# Patient Record
Sex: Male | Born: 1960 | Race: Black or African American | Hispanic: No | Marital: Married | Smoking: Never smoker
Health system: Southern US, Community
[De-identification: ages and names within clinical notes are randomized; demographics above are authoritative.]

## PROBLEM LIST (undated history)

## (undated) DIAGNOSIS — I10 Essential (primary) hypertension: Secondary | ICD-10-CM

## (undated) DIAGNOSIS — L0291 Cutaneous abscess, unspecified: Secondary | ICD-10-CM

## (undated) DIAGNOSIS — E785 Hyperlipidemia, unspecified: Secondary | ICD-10-CM

## (undated) HISTORY — PX: HIP SURGERY: SHX245

## (undated) HISTORY — PX: CATARACT EXTRACTION: SUR2

## (undated) HISTORY — PX: ELBOW SURGERY: SHX618

## (undated) HISTORY — PX: JOINT REPLACEMENT: SHX530

## (undated) HISTORY — DX: Hyperlipidemia, unspecified: E78.5

---

## 2001-10-04 ENCOUNTER — Encounter: Admission: RE | Admit: 2001-10-04 | Discharge: 2001-10-04 | Payer: Self-pay | Admitting: Family Medicine

## 2001-10-04 ENCOUNTER — Encounter: Payer: Self-pay | Admitting: Family Medicine

## 2005-12-01 ENCOUNTER — Emergency Department (HOSPITAL_COMMUNITY): Admission: EM | Admit: 2005-12-01 | Discharge: 2005-12-01 | Payer: Self-pay | Admitting: Emergency Medicine

## 2005-12-09 ENCOUNTER — Emergency Department (HOSPITAL_COMMUNITY): Admission: EM | Admit: 2005-12-09 | Discharge: 2005-12-09 | Payer: Self-pay | Admitting: Emergency Medicine

## 2006-05-30 ENCOUNTER — Emergency Department (HOSPITAL_COMMUNITY): Admission: EM | Admit: 2006-05-30 | Discharge: 2006-05-30 | Payer: Self-pay | Admitting: Emergency Medicine

## 2006-05-31 ENCOUNTER — Emergency Department (HOSPITAL_COMMUNITY): Admission: EM | Admit: 2006-05-31 | Discharge: 2006-05-31 | Payer: Self-pay | Admitting: Emergency Medicine

## 2006-06-01 ENCOUNTER — Emergency Department (HOSPITAL_COMMUNITY): Admission: EM | Admit: 2006-06-01 | Discharge: 2006-06-01 | Payer: Self-pay | Admitting: Emergency Medicine

## 2006-06-02 ENCOUNTER — Emergency Department (HOSPITAL_COMMUNITY): Admission: EM | Admit: 2006-06-02 | Discharge: 2006-06-02 | Payer: Self-pay | Admitting: Emergency Medicine

## 2006-06-03 ENCOUNTER — Emergency Department (HOSPITAL_COMMUNITY): Admission: EM | Admit: 2006-06-03 | Discharge: 2006-06-03 | Payer: Self-pay | Admitting: Emergency Medicine

## 2006-06-04 ENCOUNTER — Emergency Department (HOSPITAL_COMMUNITY): Admission: EM | Admit: 2006-06-04 | Discharge: 2006-06-04 | Payer: Self-pay | Admitting: Emergency Medicine

## 2007-03-11 ENCOUNTER — Emergency Department (HOSPITAL_COMMUNITY): Admission: EM | Admit: 2007-03-11 | Discharge: 2007-03-11 | Payer: Self-pay | Admitting: Emergency Medicine

## 2007-03-16 ENCOUNTER — Emergency Department (HOSPITAL_COMMUNITY): Admission: EM | Admit: 2007-03-16 | Discharge: 2007-03-16 | Payer: Self-pay | Admitting: Emergency Medicine

## 2009-03-01 ENCOUNTER — Ambulatory Visit: Payer: Self-pay | Admitting: Vascular Surgery

## 2009-05-25 ENCOUNTER — Ambulatory Visit: Payer: Self-pay | Admitting: Vascular Surgery

## 2009-07-12 ENCOUNTER — Ambulatory Visit: Payer: Self-pay | Admitting: Vascular Surgery

## 2009-07-20 ENCOUNTER — Ambulatory Visit: Payer: Self-pay | Admitting: Vascular Surgery

## 2009-08-16 ENCOUNTER — Ambulatory Visit: Payer: Self-pay | Admitting: Vascular Surgery

## 2009-08-25 ENCOUNTER — Ambulatory Visit: Payer: Self-pay | Admitting: Vascular Surgery

## 2009-09-13 ENCOUNTER — Ambulatory Visit: Payer: Self-pay | Admitting: Vascular Surgery

## 2009-09-21 ENCOUNTER — Ambulatory Visit: Payer: Self-pay | Admitting: Vascular Surgery

## 2010-06-14 NOTE — Procedures (Signed)
LOWER EXTREMITY VENOUS REFLUX EXAM   INDICATION:  Bilateral varicose veins with episodes of bleeding on the  left lower extremity.   EXAM:  Using color-flow imaging and pulse Doppler spectral analysis, the  right and left common femoral, superficial femoral, popliteal, posterior  tibial, greater and lesser saphenous veins are evaluated.  There is  evidence suggesting deep venous insufficiency in the right and left  lower extremities.   The  right and left saphenofemoral junctions are not competent with  reflux of  >500 milliseconds. The right and left GSV are not competent  with reflux of  >500 milliseconds with the caliber as described below.   The right and left proximal short saphenous veins demonstrates  incompetency with the right measuring 0.25 cm to 1.75 cm and the left  0.26 cm to 1.6 cm.   GSV Diameter (used if found to be incompetent only)                                            Right    Left  Proximal Greater Saphenous Vein           0.54 cm  cm  Proximal-to-mid-thigh                     cm       cm  Mid thigh                                 0.49 cm  0.45 cm  Mid-distal thigh                          cm       cm  Distal thigh                              0.22 cm  0.45 cm  Knee                                      0.38 cm  cm   IMPRESSION:  1. Right greater saphenous vein reflux with >500 milliseconds is      identified with the caliber ranging from 0.22 cm to 0.54 cm knee to      groin; the left measuring 0.45 cm.  2. The right and left greater saphenous veins are not aneurysmal.  3. The right and left greater saphenous veins are tortuous in areas.  4. The deep venous system is not competent with reflux of >500      milliseconds.  5. The right and left lesser saphenous veins are not competent with      reflux of  >500 milliseconds.         ___________________________________________  [name] Rochester, M.D.   AS/MEDQ  D:  03/01/2009  T:   03/02/2009  Job:  [id number]

## 2010-06-14 NOTE — Assessment & Plan Note (Signed)
OFFICE VISIT   Roy, [name] A  DOB:  27-Oct-1960                                       08/16/2009  EAVWU#:[id number]   The patient had laser ablation of his right small saphenous vein with 10-  20 stab phlebectomies for painful bulging varicosities in the right calf  and ankle area.  He tolerated the procedure well.  He will return on  July 27th for venous duplex exam of the right leg and to be seen by Dr.  Arbie Cookey.     [name] Rochester, M.D.  Electronically Signed   JDL/MEDQ  D:  08/16/2009  T:  08/17/2009  Job:  [id number]

## 2010-06-14 NOTE — Assessment & Plan Note (Signed)
OFFICE VISIT   LEVANDER, KATZENSTEIN A  DOB:  [birth year]                                       07/12/2009  EAVWU#:[id number]   Roy [name] had laser ablation of the left small saphenous vein with  greater than 20 stab phlebectomies of large bulging varicosities in the  calf and medial and posterior areas.  He tolerated the procedure well.  He will return in 1 week for venous duplex exam of his left small  saphenous vein.  He will then be scheduled for laser ablation of the  right small saphenous followed by the right great saphenous system.     [name] Rochester, M.D.  Electronically Signed   JDL/MEDQ  D:  07/12/2009  T:  07/13/2009  Job:  [id number]

## 2010-06-14 NOTE — Assessment & Plan Note (Signed)
OFFICE VISIT   Roy [name], Roy [name]  DOB:  1960/12/24                                       09/13/2009  [id number]#:[id number]   The patient had laser ablation of his right great saphenous vein from  the mid-thigh to the saphenofemoral junction.  He tolerated the  procedure well.  This completes his treatment regimen and he will return  in 1 week for duplex scan of the right leg to confirm closure of the  right great saphenous vein.     [name] Rochester, M.D.  Electronically Signed   JDL/MEDQ  D:  09/13/2009  T:  09/13/2009  Job:  [id number]

## 2010-06-14 NOTE — Assessment & Plan Note (Signed)
OFFICE VISIT   GAIUS, ISHAQ A  DOB:  [birth year]                                       09/21/2009  NFAOZ#:[id number]   The patient returns 1 week post laser ablation of his right great  saphenous vein for severe venous hypertension.  He previously has had  ablation of his right small saphenous with stab phlebectomies as well as  his left small saphenous with stab phlebectomies.  He states that the  size of both legs is significantly smaller than prior to his procedures.  He has had some mild to moderate discomfort in the proximal thigh on the  right side from the recent ablation but that has now resolved.  He has  returned to work.  He is wearing the stockings on the right leg for one  more week and has taken ibuprofen as instructed.   Today venous duplex exam reveals total closure of the right great  saphenous and the right small saphenous veins with no DVT.  He was  reassured regarding these findings.  He is pleased with his result and  will return to see [place] on a p.r.n. basis.     [name] Rochester, M.D.  Electronically Signed   JDL/MEDQ  D:  09/21/2009  T:  09/22/2009  Job:  [id number]

## 2010-06-14 NOTE — Assessment & Plan Note (Signed)
OFFICE VISIT   Roy [name], Roy [name]  DOB:  1960-10-25                                       05/25/2009  FAOZH#:[id number]   The patient returns today for further followup regarding his severe  bilateral venous insufficiency.  This gentleman has [name] history of [name]  stasis ulcer in the left ankle as well as three episodes of bleeding  most recently in November of 2010 and he has had two trips to the  emergency department at [facility] for ligation of bleeding  varicosities.  He has been wearing long leg elastic compression  stockings on both legs for the past 3 months and continues to have  aching, throbbing, burning discomfort as well as itching in both thighs  and calves the more he stands on his feet.  He has not had any recurrent  bleeding since November.  He has no history of DVT or thrombophlebitis  but has chronic swelling in both ankles which worsens as the day  progresses.  He does have documented severe reflux in the right great  saphenous vein in both small saphenous veins.   On exam today he continues to have bulging varicosities in both legs in  the medial and posterior calves extending down into the ankles with [name]  healed ulcer anterior to the left medial malleolus and 2 to 3+ dorsalis  pedis pulses bilaterally.   This gentleman has had no improvement with conservative management and  because of his history of ulcers and bleeding he does need the following  procedures:  1) laser ablation of the left small saphenous vein with  multiple stab phlebectomies, 2) laser ablation of the right small  saphenous vein with multiple stab phlebectomies, 3) laser ablation of  the right great saphenous.  We will proceed with precertification to do  these in the near future.     [name] Rochester, M.D.  Electronically Signed   JDL/MEDQ  D:  05/25/2009  T:  05/26/2009  Job:  [id number]   cc:   Renaye Rakers, M.D.

## 2010-06-14 NOTE — Assessment & Plan Note (Signed)
OFFICE VISIT   Roy [name], Roy [name]  DOB:  [birth year]                                       08/25/2009  ZOXWR#:[id number]   The patient presents today for followup of right small saphenous vein  laser ablation and stab phlebectomy by Dr. Hart Rochester on 07/18.  He did well  with the procedure and has minimal pain associated with this.   He underwent duplex which shows no evidence of DVT and ablation of [name]  small saphenous vein.  He is quite pleased with his result and will  follow up with next planned procedure which is right great saphenous  vein ablation with Dr. Hart Rochester on 09/13/2009.     Larina Earthly, M.D.  Electronically Signed   TFE/MEDQ  D:  08/25/2009  T:  08/26/2009  Job:  [id number]

## 2010-06-14 NOTE — Assessment & Plan Note (Signed)
OFFICE VISIT   CAYLON, SAINE A  DOB:  [birth year]                                       07/20/2009  [id number]#:[id number]   The patient had laser ablation of his left small saphenous vein with  greater than 20 stab phlebectomies June 13 for painful varicosities in  the left calf.  He states that the leg feels much better with less  swelling in the left ankle and less tightness and bulging discomfort in  the calf.  He has been wearing his elastic compression stocking and  taking ibuprofen as directed.  Venous duplex today reveals no evidence  of deep venous obstruction, total closure of the small saphenous vein  from the mid calf to the saphenofemoral junction.  There is some flow in  his distal lesser saphenous vein probably from incompetent perforators.  In general he is doing well and we will schedule him for his other two  procedures the next being the laser ablation of his right small  saphenous to be followed by laser ablation of his right great saphenous.     [name] Rochester, M.D.  Electronically Signed   JDL/MEDQ  D:  07/20/2009  T:  07/21/2009  Job:  [id number]

## 2010-06-14 NOTE — Procedures (Signed)
DUPLEX DEEP VENOUS EXAM - LOWER EXTREMITY   INDICATION:  Left lesser saphenous vein ablation.   HISTORY:  Edema:  Yes  Trauma/Surgery:  One-week followup left lesser saphenous vein ablation  Pain:  Yes  PE:  No  Previous DVT:  No  Anticoagulants:  No  Other:  No   DUPLEX EXAM:                CFV   SFV   PopV  PTV    GSV                R  L  R  L  R  L  R   L  R  L  Thrombosis    0  0     0     0      0     0  Spontaneous   +  +     +     +      +     +  Phasic        +  +     +     +      +     +  Augmentation  +  +     +     +      +     +  Compressible  +  +     +     +      +     +  Competent     0  0     0     0      0     +   Legend:  + - yes  o - no  p - partial  D - decreased   IMPRESSION:  There does not appear to be any deep venous thrombus noted  in the left leg.  There is reflux noted in the left leg deep vein  system.  The left lesser saphenous vein appears ablated at the junction  to the mid calf.    _____________________________  Roy [name]. Hart Rochester, M.D.   CB/MEDQ  D:  07/20/2009  T:  07/20/2009  Job:  [id number]

## 2010-06-14 NOTE — Consult Note (Signed)
NEW PATIENT CONSULTATION   Roy Roy [name], Roy Roy [name]  DOB:  Dec 06, 1960                                       03/01/2009  PPIRJ#:[id number]   The patient is Roy [name] 50 year old male referred by Dr. Parke Simmers for severe  venous insufficiency of both legs with Roy [name] history of stasis ulcer in the  left ankle as well as bleeding on 3 occasions.  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  He has described active bleeding on each  occasion which has stopped with local pressure.  He has been treating  this with local ointment but  has not worn elastic compression stockings  on Roy [name] regular basis.  He has aching and throbbing discomfort in the  distal thigh and calf bilaterally left worse than right and also has had  increasing edema of the left lower third of the leg, ankle and foot with  darkening of the skin over the last few years and ulcer being present on  the left side.  He has no history of deep venous thrombosis or  thrombophlebitis but does have significant edema.   CHRONIC STABLE MEDICAL PROBLEMS:  1. Hypertension.  2. Degenerative joint disease.  Previous bilateral hip replacements.      Negative for coronary artery disease, diabetes, COPD or stroke.   FAMILY HISTORY:  Negative for coronary artery disease, diabetes and  stroke.   SOCIAL HISTORY:  He is married, has 3 children.  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  He has not smoked since 1984.  Drinks  occasional alcohol.   REVIEW OF SYSTEMS:  Has 300+ weight, 6 feet 1 inch tall, has good  appetite.  Denies any chest pain, dyspnea on exertion, PND, orthopnea.  Does have diffuse arthritis.  No GI or GU symptoms.  All other systems  in the review of systems are negative.   ALLERGIES:  None.   PHYSICAL EXAMINATION:  VITAL SIGNS:  Blood pressure 177/108, heart  rate  is 103, respirations 24.  GENERAL:  He is an obese middle-aged male who is in no apparent  distress, alert and oriented x3.  He is well-developed and well-  nourished.  NECK:  Supple, 3+ carotid pulses are palpable. No bruits are audible.  HEENT:  Exam is intact.  EOMs are intact.  Pupils are equal.  NECK:  Supple with no palpable adenopathy.  No bruits are noted.  CHEST:  Clear to auscultation.  CARDIOVASCULAR:  Regular rhythm.  No murmurs.  ABDOMEN:  Obese.  No palpable masses.  MUSCULOSKELETAL:  Exam reveals no major deformities.  NEUROLOGIC:  Normal.  SKIN:  Reveals bilateral hyperpigmentation lower third of the leg, left  greater than right with Roy [name] healed ulcer near the left medial malleolus  with an eschar about 3 to 4 mm in size overlying this.  There are  bulging varicosities in both lower extremities particularly beginning at  the knee to the ankle medially and posteriorly with Roy [name] large bulging  varicosity on the medial side of the right leg, 10 cm proximal to the  medial malleolus.  He has 2+ edema bilaterally.  Venous duplex exam was ordered and I reviewed and interpreted this  today.  He has the following findings:  He has deep venous reflux  bilaterally as well as severe reflux in the left small saphenous vein  communicating with these bulging varicosities.  The left great saphenous  vein in the thigh is competent.  On the right side there is incompetence  at the proximal right great saphenous vein from the junction to the mid  thigh and there is also incompetence of the right small saphenous vein  similar to the left side.   This patient has had 3 bleeding episodes due to deep venous  hypertension, has hyperpigmentation, stasis ulcers and pain and does  need treatment.  We fitted him for long-leg elastic compression  stockings (20 mm - 30 mm gradient) and have advised him to also elevate  his legs as much as possible although he cannot do this at work.  He  will  also take ibuprofen.  He will return in 3 months and if there has  been no improvement, he should have:  1. Laser ablation of the left small saphenous vein with multiple stab      phlebectomies.  2. Laser ablation of the right small saphenous vein with multiple stab      phlebectomies.  3. Laser ablation of the right great saphenous vein.   If he gets any further bleeding in the interim, he will be in touch with  [place].     Roy Roy [name], M.D.  Electronically Signed   JDL/MEDQ  D:  03/01/2009  T:  03/02/2009  Job:  [id number]   cc:   Roy Roy [name], M.D.

## 2010-06-14 NOTE — Procedures (Signed)
DUPLEX DEEP VENOUS EXAM - LOWER EXTREMITY   INDICATION:  Follow up right smaller saphenous vein ELAS and  phlebectomies.   HISTORY:  Edema:  No.  Trauma/Surgery:  Right smaller saphenous vein ELAS and phlebectomies on  08/16/09.  Pain:  No.  PE:  No.  Previous DVT:  No.  Anticoagulants:  No.  Other:   DUPLEX EXAM:                CFV   SFV   PopV  PTV    GSV                R  L  R  L  R  L  R   L  R  L  Thrombosis    o  o  o     o     o      o  Spontaneous   +  +  +     +     +      +  Phasic        +  +  +     +     +      +  Augmentation  +  +  +     +     +      +  Compressible  +  +  +     +     +      +  Competent     +  +  +     +     +      +   Legend:  + - yes  o - no  p - partial  D - decreased   IMPRESSION:  1. No evidence of deep venous thrombosis in the right lower extremity.  2. The smaller saphenous vein appears to be ablated from the junction      to the mid to distal calf.  The areas of phlebectomy also appear to      be closed off.    _____________________________  Larina Earthly, M.D.   NT/MEDQ  D:  08/25/2009  T:  08/25/2009  Job:  [id number]

## 2010-06-14 NOTE — Procedures (Signed)
DUPLEX DEEP VENOUS EXAM - LOWER EXTREMITY   INDICATION:  One-week followup of right greater saphenous vein laser  ablation.   HISTORY:  Edema:  No.  Trauma/Surgery:  Left short saphenous vein laser ablation on 07/12/2009,  right short saphenous vein laser ablation on 08/14/2009, right greater  saphenous vein laser ablation on 09/14/2009.  Pain:  No.  PE:  No.  Previous DVT:  No.  Anticoagulants:  No.  Other:   DUPLEX EXAM:                CFV   SFV   PopV  PTV    GSV                R  L  R  L  R  L  R   L  R  L  Thrombosis    o  o  o     o     o      +  Spontaneous   +  +  +     +     +      o  Phasic        +  +  +     +     +      o  Augmentation  +  +  +     +     +      o  Compressible  +  +  +     +     +      o  Competent     o  o  o     o            O   Legend:  + - yes  o - no  p - partial  D - decreased   IMPRESSION:  1. No evidence of deep venous thrombosis noted in the right lower      extremity.  2. Total occlusion of the right greater saphenous vein extending from      the distal insertion site to near the saphenofemoral junction.  3. Total occlusion of the right short saphenous vein extending from      the distal insertion site to near the saphenopopliteal junction.  4. Clinically significant reflux is noted throughout the right femoral      popliteal venous system and in the left common femoral vein.    _____________________________  Quita Skye. Hart Rochester, M.D.   CH/MEDQ  D:  09/21/2009  T:  09/21/2009  Job:  [id number]

## 2016-05-25 ENCOUNTER — Emergency Department (HOSPITAL_COMMUNITY)
Admission: EM | Admit: 2016-05-25 | Discharge: 2016-05-25 | Disposition: A | Discharge status: Home / Self Care | Payer: BLUE CROSS/BLUE SHIELD | Attending: Emergency Medicine | Admitting: Emergency Medicine

## 2016-05-25 ENCOUNTER — Encounter (HOSPITAL_COMMUNITY): Payer: Self-pay | Admitting: Emergency Medicine

## 2016-05-25 ENCOUNTER — Emergency Department (HOSPITAL_COMMUNITY): Payer: BLUE CROSS/BLUE SHIELD

## 2016-05-25 DIAGNOSIS — Z7982 Long term (current) use of aspirin: Secondary | ICD-10-CM | POA: Diagnosis not present

## 2016-05-25 DIAGNOSIS — I10 Essential (primary) hypertension: Secondary | ICD-10-CM | POA: Diagnosis not present

## 2016-05-25 DIAGNOSIS — Y929 Unspecified place or not applicable: Secondary | ICD-10-CM | POA: Insufficient documentation

## 2016-05-25 DIAGNOSIS — M7989 Other specified soft tissue disorders: Secondary | ICD-10-CM | POA: Diagnosis not present

## 2016-05-25 DIAGNOSIS — S6991XA Unspecified injury of right wrist, hand and finger(s), initial encounter: Secondary | ICD-10-CM | POA: Diagnosis not present

## 2016-05-25 DIAGNOSIS — W228XXA Striking against or struck by other objects, initial encounter: Secondary | ICD-10-CM | POA: Diagnosis not present

## 2016-05-25 DIAGNOSIS — Y999 Unspecified external cause status: Secondary | ICD-10-CM | POA: Insufficient documentation

## 2016-05-25 DIAGNOSIS — Z79899 Other long term (current) drug therapy: Secondary | ICD-10-CM | POA: Insufficient documentation

## 2016-05-25 DIAGNOSIS — M79641 Pain in right hand: Secondary | ICD-10-CM | POA: Diagnosis not present

## 2016-05-25 DIAGNOSIS — Y939 Activity, unspecified: Secondary | ICD-10-CM | POA: Insufficient documentation

## 2016-05-25 HISTORY — DX: Essential (primary) hypertension: I10

## 2016-05-25 MED ORDER — PREDNISONE 10 MG PO TABS: 20.0000 mg | ORAL_TABLET | Freq: Every day | ORAL | 0 refills | Status: DC

## 2016-05-25 MED ORDER — DICLOFENAC SODIUM 75 MG PO TBEC: 75.0000 mg | DELAYED_RELEASE_TABLET | Freq: Two times a day (BID) | ORAL | 0 refills | Status: DC

## 2016-05-25 MED ORDER — LISINOPRIL-HYDROCHLOROTHIAZIDE 10-12.5 MG PO TABS: 1.0000 | ORAL_TABLET | Freq: Every day | ORAL | 1 refills | Status: DC

## 2016-05-25 NOTE — ED Provider Notes (Signed)
[facility]-EMERGENCY DEPT Provider Note   CSN: [id number] Arrival date & time: 05/25/16  1627     History   Chief Complaint Chief Complaint  Patient presents with  . Hand Pain    HPI Roy [name] is a 56 y.o. male.  Patient struck the back of his right hand approximately 1 week ago against a firm object. He now complains of pain and swelling in the dorsum of the hand. He has hypertension, but has not been taking his medication. No chest pain, dyspnea, neurodeficits.      Past Medical History:  Diagnosis Date  . Hypertension     There are no active problems to display for this patient.   Past Surgical History:  Procedure Laterality Date  . JOINT REPLACEMENT         Home Medications    Prior to Admission medications   Medication Sig Start Date End Date Taking? Authorizing Provider  aspirin 325 MG tablet Take 325 mg by mouth daily.   Yes Historical Provider, MD  co-enzyme Q-10 50 MG capsule Take 50 mg by mouth daily.   Yes Historical Provider, MD  Multiple Vitamin (MULTIVITAMIN) tablet Take 1 tablet by mouth daily.   Yes Historical Provider, MD  Omega-3 Fatty Acids (FISH OIL) 1000 MG CAPS Take 1 capsule by mouth daily.   Yes Historical Provider, MD  diclofenac (VOLTAREN) 75 MG EC tablet Take 1 tablet (75 mg total) by mouth 2 (two) times daily. 05/25/16   Donnetta Hutching, MD  lisinopril-hydrochlorothiazide (PRINZIDE,ZESTORETIC) 10-12.5 MG tablet Take 1 tablet by mouth daily. 05/25/16   Donnetta Hutching, MD  predniSONE (DELTASONE) 10 MG tablet Take 2 tablets (20 mg total) by mouth daily. 05/25/16   Donnetta Hutching, MD    Family History History reviewed. No pertinent family history.  Social History Social History  Substance Use Topics  . Smoking status: Never Smoker  . Smokeless tobacco: Never Used  . Alcohol use No     Allergies   Patient has no known allergies.   Review of Systems Review of Systems  All other systems reviewed and are negative.    Physical Exam Updated  Vital Signs BP (!) 196/120 (BP Location: Left Arm)   Pulse (!) 115   Temp 98.2 F (36.8 C) (Oral)   Resp 20   Ht  (1.854 m)   Wt (!) 320 lb (145.2 kg)   SpO2 98%   BMI 42.22 kg/m   Physical Exam  Constitutional: He is oriented to person, place, and time.  Obese; hypertensive  HENT:  Head: Normocephalic and atraumatic.  Eyes: Conjunctivae are normal.  Neck: Neck supple.  Cardiovascular: Normal rate and regular rhythm.   Pulmonary/Chest: Effort normal and breath sounds normal.  Abdominal: Soft. Bowel sounds are normal.  Musculoskeletal:  Right hand:  Swelling on the dorsum of the hand especially over the second and third carpal bone. Also pain with range of motion of the index finger.  Neurological: He is alert and oriented to person, place, and time.  Skin: Skin is warm and dry.  Psychiatric: He has a normal mood and affect. His behavior is normal.  Nursing note and vitals reviewed.    ED Treatments / Results  Labs (all labs ordered are listed, but only abnormal results are displayed) Labs Reviewed - No data to display  EKG  EKG Interpretation None       Radiology Dg Hand Complete Right  Result Date: 05/25/2016 CLINICAL DATA:  Hand swelling after injury one  week ago. EXAM: RIGHT HAND - COMPLETE 3+ VIEW COMPARISON:  None. FINDINGS: No evidence of fracture. No subluxation or dislocation. Degenerative changes seen in scattered PIP joints and in the MCP joint of the thumb. Soft tissue swelling noted in the region of the thenar eminence and overlying the metacarpals. IMPRESSION: Soft tissue swelling without acute bony abnormality. Electronically Signed   By: Kennith Center M.D.   On: 05/25/2016 17:18    Procedures Procedures (including critical care time)  Medications Ordered in ED Medications - No data to display   Initial Impression / Assessment and Plan / ED Course  I have reviewed the triage vital signs and the nursing notes.  Pertinent labs & imaging  results that were available during my care of the patient were reviewed by me and considered in my medical decision making (see chart for details).     X-ray right hand shows no fracture. Will apply wrist splint for support. Will restart his blood pressure medication. He understands the need for primary care follow-up. Discharge medications include prednisone, Voltaren 75 mg, lisinopril/hydrochlorothiazide 10/12.5  Final Clinical Impressions(s) / ED Diagnoses   Final diagnoses:  Right hand pain  Hypertension, unspecified type    New Prescriptions New Prescriptions   DICLOFENAC (VOLTAREN) 75 MG EC TABLET    Take 1 tablet (75 mg total) by mouth 2 (two) times daily.   LISINOPRIL-HYDROCHLOROTHIAZIDE (PRINZIDE,ZESTORETIC) 10-12.5 MG TABLET    Take 1 tablet by mouth daily.   PREDNISONE (DELTASONE) 10 MG TABLET    Take 2 tablets (20 mg total) by mouth daily.     Donnetta Hutching, MD 05/25/16 Corky Crafts

## 2016-05-25 NOTE — ED Triage Notes (Signed)
Pt reports hitting his right hand about a week ago and now it is swollen and painful.  Pt did not take his high blood pressure meds today.

## 2016-05-25 NOTE — Discharge Instructions (Signed)
X-ray shows no broken bones. Recommend brace. Ice pack. Prescription for prednisone, pain medicine, blood pressure medicine.  It is important she get primary care follow-up for your blood pressure.

## 2017-02-26 ENCOUNTER — Encounter (HOSPITAL_COMMUNITY): Payer: Self-pay | Admitting: Emergency Medicine

## 2017-02-26 ENCOUNTER — Ambulatory Visit (HOSPITAL_COMMUNITY)
Admission: EM | Admit: 2017-02-26 | Discharge: 2017-02-26 | Disposition: A | Discharge status: Home / Self Care | Payer: BLUE CROSS/BLUE SHIELD | Attending: Family Medicine | Admitting: Family Medicine

## 2017-02-26 DIAGNOSIS — L0291 Cutaneous abscess, unspecified: Secondary | ICD-10-CM

## 2017-02-26 DIAGNOSIS — L02416 Cutaneous abscess of left lower limb: Secondary | ICD-10-CM

## 2017-02-26 DIAGNOSIS — Z760 Encounter for issue of repeat prescription: Secondary | ICD-10-CM

## 2017-02-26 DIAGNOSIS — I10 Essential (primary) hypertension: Secondary | ICD-10-CM

## 2017-02-26 MED ORDER — NEBIVOLOL HCL 2.5 MG PO TABS: 2.5000 mg | ORAL_TABLET | Freq: Every day | ORAL | 0 refills | Status: DC

## 2017-02-26 MED ORDER — DOXYCYCLINE HYCLATE 100 MG PO CAPS: 100.0000 mg | ORAL_CAPSULE | Freq: Two times a day (BID) | ORAL | 0 refills | Status: DC

## 2017-02-26 MED ORDER — LISINOPRIL-HYDROCHLOROTHIAZIDE 10-12.5 MG PO TABS: 1.0000 | ORAL_TABLET | Freq: Every day | ORAL | 0 refills | Status: DC

## 2017-02-26 NOTE — ED Provider Notes (Signed)
[facility]    CSN: [id number] Arrival date & time: 02/26/17  1352     History   Chief Complaint Chief Complaint  Patient presents with  . Abscess    HPI Roy [name] is a 57 y.o. male history of hypertension presenting today for medication refill of his hypertension medications-lisinopril HCTZ combo and Bystolic.  He has been out of this for 6-8 months.  Denies symptoms to include headache, weakness, vision changes, shortness of breath, chest pain, decreased urine output.  Is also concerned about an abscess on the back of his left thigh.  This is been there for about a week.  Initially thought it was a pulled muscle treated with Aspercreme.  Then noticed it starting to drain since last Wednesday.  Mother is currently addressing it.  Denies ever having anything like this before.  HPI  Past Medical History:  Diagnosis Date  . Hypertension     There are no active problems to display for this patient.   Past Surgical History:  Procedure Laterality Date  . JOINT REPLACEMENT         Home Medications    Prior to Admission medications   Medication Sig Start Date End Date Taking? Authorizing Provider  aspirin 325 MG tablet Take 325 mg by mouth daily.   Yes [provider]  diclofenac (VOLTAREN) 75 MG EC tablet Take 1 tablet (75 mg total) by mouth 2 (two) times daily. 05/25/16  Yes Donnetta Hutching, MD  Multiple Vitamin (MULTIVITAMIN) tablet Take 1 tablet by mouth daily.   Yes [provider]  Omega-3 Fatty Acids (FISH OIL) 1000 MG CAPS Take 1 capsule by mouth daily.   Yes [provider]  co-enzyme Q-10 50 MG capsule Take 50 mg by mouth daily.    [provider]  doxycycline (VIBRAMYCIN) 100 MG capsule Take 1 capsule (100 mg total) by mouth 2 (two) times daily for 10 days. 02/26/17 03/08/17  Wieters, Hallie C, PA-C  lisinopril-hydrochlorothiazide (PRINZIDE,ZESTORETIC) 10-12.5 MG tablet Take 1 tablet by mouth daily. 02/26/17 03/28/17   Wieters, Hallie C, PA-C  nebivolol (BYSTOLIC) 2.5 MG tablet Take 1 tablet (2.5 mg total) by mouth daily. 02/26/17 03/28/17  Wieters, Hallie C, PA-C  predniSONE (DELTASONE) 10 MG tablet Take 2 tablets (20 mg total) by mouth daily. 05/25/16   Donnetta Hutching, MD    Family History No family history on file.  Social History Social History   Tobacco Use  . Smoking status: Never Smoker  . Smokeless tobacco: Never Used  Substance Use Topics  . Alcohol use: No  . Drug use: No     Allergies   Patient has no known allergies.   Review of Systems Review of Systems  Constitutional: Negative for chills and fever.  HENT: Negative for sore throat.   Eyes: Negative for pain and visual disturbance.  Respiratory: Negative for cough and shortness of breath.   Cardiovascular: Negative for chest pain and palpitations.  Gastrointestinal: Negative for abdominal pain, nausea and vomiting.  Genitourinary: Negative for decreased urine volume and difficulty urinating.  Musculoskeletal: Negative for arthralgias and back pain.  Skin: Positive for wound. Negative for color change.  Neurological: Negative for dizziness, syncope, weakness, light-headedness and headaches.  All other systems reviewed and are negative.    Physical Exam Triage Vital Signs ED Triage Vitals  Enc Vitals Group     BP 02/26/17 1439 (!) 180/120     Pulse Rate 02/26/17 1437 (!) 114     Resp  02/26/17 1437 16     Temp 02/26/17 1437 99.6 F (37.6 C)     Temp src --      SpO2 02/26/17 1437 99 %     Weight 02/26/17 1438 (!) 322 lb (146.1 kg)     Height --      Head Circumference --      Peak Flow --      Pain Score 02/26/17 1437 7     Pain Loc --      Pain Edu? --      Excl. in GC? --    No data found.  Updated Vital Signs BP (!) 180/120   Pulse (!) 114   Temp 99.6 F (37.6 C)   Resp 16   Wt (!) 322 lb (146.1 kg)   SpO2 99%   BMI 42.48 kg/m   Vi Physical Exam  Constitutional: He appears well-developed and  well-nourished.  HENT:  Head: Normocephalic and atraumatic.  Eyes: Conjunctivae and EOM are normal. Pupils are equal, round, and reactive to light.  Neck: Neck supple.  Cardiovascular: Regular rhythm.  No murmur heard. Tachycardic  Pulmonary/Chest: Effort normal and breath sounds normal. No respiratory distress.  Abdominal: Soft. There is no tenderness.  Musculoskeletal: He exhibits no edema.  Neurological: He is alert.  Skin: Skin is warm and dry.  Large 9 cm area with central erythema and draining and surrounding induration.  Psychiatric: He has a normal mood and affect.  Nursing note and vitals reviewed.      UC Treatments / Results  Labs (all labs ordered are listed, but only abnormal results are displayed) Labs Reviewed - No data to display  EKG  EKG Interpretation None       Radiology No results found.  Procedures Procedures (including critical care time)  Medications Ordered in UC Medications - No data to display   Initial Impression / Assessment and Plan / UC Course  I have reviewed the triage vital signs and the nursing notes.  Pertinent labs & imaging results that were available during my care of the patient were reviewed by me and considered in my medical decision making (see chart for details).     Patient with elevated blood pressure today, asymptomatic.  Refilled his lisinopril and HCTZ and Bystolic.  He was unsure of his Bystolic dosing, supplied 2.5 mg.  Advised to monitor blood pressure at home, follow-up with primary care doctor.  Advised to go to emergency room if he develops any symptoms to include severe headache, vision changes, slurring of speech, one-sided weakness, chest pain, shortness of breath, decreased urine output.  Area on back of thigh appears to be an abscess, actively draining in areas.  Area is large and appears to need further drainage.  [facility] surgery and set up an appointment for 2 PM tomorrow.  We will go  ahead and start on doxycycline twice daily.  Advised to try warm compresses.  Dressing applied.  Final Clinical Impressions(s) / UC Diagnoses   Final diagnoses:  Abscess  Medication refill    ED Discharge Orders        Ordered    lisinopril-hydrochlorothiazide (PRINZIDE,ZESTORETIC) 10-12.5 MG tablet  Daily,   Status:  Discontinued     02/26/17 1529    nebivolol (BYSTOLIC) 2.5 MG tablet  Daily     02/26/17 1529    doxycycline (VIBRAMYCIN) 100 MG capsule  2 times daily     02/26/17 1529    lisinopril-hydrochlorothiazide (PRINZIDE,ZESTORETIC) 10-12.5 MG tablet  Daily     02/26/17 1530       Controlled Substance Prescriptions [place] Controlled Substance Registry consulted? Not Applicable   Lew DawesWieters, Hallie C, [place] JerseyPA-C 02/26/17 1547

## 2017-02-26 NOTE — ED Triage Notes (Signed)
PT reports abscess on left buttocks that he first noticed Wednesday. It started draining later that day.

## 2017-02-26 NOTE — ED Notes (Signed)
Telfa Pad and Kerlex applied to wound on pt's right upper posterior thigh.  Pt tolerated it well.

## 2017-02-26 NOTE — ED Triage Notes (Signed)
PT has been out of HTN meds for 6-8 months

## 2017-02-26 NOTE — Discharge Instructions (Signed)
I have refilled your lisinopril-HCTZ and Bystolic.  Please monitor blood pressure at home daily.  Follow-up with primary care doctor to ensure control on these medicines.  If you begin to have a severe headache changes in vision, one-sided weakness, slurring of speech, chest pain, shortness of breath, decreased urine please go to emergency room immediately.  For your abscess on the back of your thigh please start doxycycline twice daily for 10 days.  I have made an appointment for you with [facility] tomorrow at 2 PM.  Please arrive by this time.  Please be sure to have your ID, insurance card, and specialist co-pay.  May apply warm compresses in the meantime.

## 2017-02-27 ENCOUNTER — Encounter (HOSPITAL_COMMUNITY): Payer: Self-pay | Admitting: General Practice

## 2017-02-27 ENCOUNTER — Other Ambulatory Visit: Payer: Self-pay | Admitting: General Surgery

## 2017-02-27 ENCOUNTER — Observation Stay (HOSPITAL_COMMUNITY)
Admission: AD | Admit: 2017-02-27 | Discharge: 2017-03-01 | Disposition: A | Discharge status: Home / Self Care | Payer: BLUE CROSS/BLUE SHIELD | Source: Ambulatory Visit | Attending: General Surgery | Admitting: General Surgery

## 2017-02-27 ENCOUNTER — Other Ambulatory Visit: Payer: Self-pay | Admitting: Student

## 2017-02-27 ENCOUNTER — Other Ambulatory Visit: Payer: Self-pay

## 2017-02-27 DIAGNOSIS — L02416 Cutaneous abscess of left lower limb: Secondary | ICD-10-CM | POA: Diagnosis not present

## 2017-02-27 DIAGNOSIS — N179 Acute kidney failure, unspecified: Secondary | ICD-10-CM | POA: Insufficient documentation

## 2017-02-27 DIAGNOSIS — Z87891 Personal history of nicotine dependence: Secondary | ICD-10-CM | POA: Insufficient documentation

## 2017-02-27 DIAGNOSIS — Z6841 Body Mass Index (BMI) 40.0 and over, adult: Secondary | ICD-10-CM | POA: Insufficient documentation

## 2017-02-27 DIAGNOSIS — Z79899 Other long term (current) drug therapy: Secondary | ICD-10-CM | POA: Insufficient documentation

## 2017-02-27 DIAGNOSIS — I10 Essential (primary) hypertension: Secondary | ICD-10-CM | POA: Insufficient documentation

## 2017-02-27 DIAGNOSIS — Z01818 Encounter for other preprocedural examination: Secondary | ICD-10-CM

## 2017-02-27 DIAGNOSIS — L0291 Cutaneous abscess, unspecified: Secondary | ICD-10-CM

## 2017-02-27 HISTORY — DX: Cutaneous abscess, unspecified: L02.91

## 2017-02-27 LAB — CBC WITH DIFFERENTIAL/PLATELET
BASOS ABS: 0.1 10*3/uL (ref 0.0–0.1)
BASOS PCT: 0 %
EOS ABS: 0.3 10*3/uL (ref 0.0–0.7)
EOS PCT: 2 %
HCT: 42.2 % (ref 39.0–52.0)
Hemoglobin: 14 g/dL (ref 13.0–17.0)
Lymphocytes Relative: 20 %
Lymphs Abs: 2.7 10*3/uL (ref 0.7–4.0)
MCH: 29.3 pg (ref 26.0–34.0)
MCHC: 33.2 g/dL (ref 30.0–36.0)
MCV: 88.3 fL (ref 78.0–100.0)
Monocytes Absolute: 1.1 10*3/uL — ABNORMAL HIGH (ref 0.1–1.0)
Monocytes Relative: 8 %
NEUTROS PCT: 70 %
Neutro Abs: 9.7 10*3/uL — ABNORMAL HIGH (ref 1.7–7.7)
PLATELETS: 364 10*3/uL (ref 150–400)
RBC: 4.78 MIL/uL (ref 4.22–5.81)
RDW: 12.9 % (ref 11.5–15.5)
WBC: 13.8 10*3/uL — AB (ref 4.0–10.5)

## 2017-02-27 LAB — LIPID PANEL
CHOL/HDL RATIO: 4.4 ratio
Cholesterol: 166 mg/dL (ref 0–200)
HDL: 38 mg/dL — AB (ref >40)
LDL CALC: 104 mg/dL — AB (ref 0–99)
TRIGLYCERIDES: 122 mg/dL (ref <150)
VLDL: 24 mg/dL (ref 0–40)

## 2017-02-27 LAB — COMPREHENSIVE METABOLIC PANEL
ALBUMIN: 3.5 g/dL (ref 3.5–5.0)
ALT: 33 U/L (ref 17–63)
AST: 31 U/L (ref 15–41)
Alkaline Phosphatase: 98 U/L (ref 38–126)
Anion gap: 12 (ref 5–15)
BUN: 24 mg/dL — ABNORMAL HIGH (ref 6–20)
CHLORIDE: 98 mmol/L — AB (ref 101–111)
CO2: 27 mmol/L (ref 22–32)
Calcium: 9.4 mg/dL (ref 8.9–10.3)
Creatinine, Ser: 1.52 mg/dL — ABNORMAL HIGH (ref 0.61–1.24)
GFR calc non Af Amer: 49 mL/min — ABNORMAL LOW (ref >60)
GFR, EST AFRICAN AMERICAN: 57 mL/min — AB (ref >60)
GLUCOSE: 105 mg/dL — AB (ref 65–99)
Potassium: 4.6 mmol/L (ref 3.5–5.1)
SODIUM: 137 mmol/L (ref 135–145)
Total Bilirubin: 0.5 mg/dL (ref 0.3–1.2)
Total Protein: 8.1 g/dL (ref 6.5–8.1)

## 2017-02-27 LAB — TSH: TSH: 2.25 u[IU]/mL (ref 0.350–4.500)

## 2017-02-27 LAB — HEMOGLOBIN A1C
HEMOGLOBIN A1C: 6 % — AB (ref 4.8–5.6)
Mean Plasma Glucose: 125.5 mg/dL

## 2017-02-27 MED ORDER — SODIUM CHLORIDE 0.9 % IV SOLN
INTRAVENOUS | Status: DC
  Administered 2017-02-27 – 2017-02-28 (×2): via INTRAVENOUS

## 2017-02-27 MED ORDER — MORPHINE SULFATE (PF) 4 MG/ML IV SOLN
1.0000 mg | INTRAVENOUS | Status: DC | PRN
  Administered 2017-03-01: 1 mg via INTRAVENOUS
  Filled 2017-02-27: qty 1

## 2017-02-27 MED ORDER — HYDRALAZINE HCL 20 MG/ML IJ SOLN
10.0000 mg | INTRAMUSCULAR | Status: DC | PRN
  Administered 2017-02-27 – 2017-02-28 (×3): 10 mg via INTRAVENOUS
  Filled 2017-02-27 (×4): qty 1

## 2017-02-27 MED ORDER — PIPERACILLIN-TAZOBACTAM 3.375 G IVPB
3.3750 g | Freq: Three times a day (TID) | INTRAVENOUS | Status: DC
  Administered 2017-02-27 – 2017-03-01 (×6): 3.375 g via INTRAVENOUS
  Filled 2017-02-27 (×7): qty 50

## 2017-02-27 MED ORDER — INFLUENZA VAC SPLIT QUAD 0.5 ML IM SUSY: 0.5000 mL | PREFILLED_SYRINGE | INTRAMUSCULAR | Status: DC

## 2017-02-27 MED ORDER — NEBIVOLOL HCL 2.5 MG PO TABS
2.5000 mg | ORAL_TABLET | Freq: Every day | ORAL | Status: DC
  Administered 2017-02-27: 2.5 mg via ORAL
  Filled 2017-02-27 (×2): qty 1

## 2017-02-27 MED ORDER — DIPHENHYDRAMINE HCL 25 MG PO CAPS: 25.0000 mg | ORAL_CAPSULE | Freq: Four times a day (QID) | ORAL | Status: DC | PRN

## 2017-02-27 MED ORDER — DIPHENHYDRAMINE HCL 50 MG/ML IJ SOLN: 25.0000 mg | Freq: Four times a day (QID) | INTRAMUSCULAR | Status: DC | PRN

## 2017-02-27 MED ORDER — AMLODIPINE BESYLATE 5 MG PO TABS
5.0000 mg | ORAL_TABLET | Freq: Every day | ORAL | Status: DC
  Filled 2017-02-27: qty 1

## 2017-02-27 NOTE — Progress Notes (Signed)
Medical Consultation   Roy [name] Kevorkian  WUX:[id number]  DOB: 1960-12-09  DOA: 02/27/2017  PCP: Renaye RakersBland, Veita, MD   Outpatient Specialists: None   Requesting physician: Dr Claud KelpIngram Haywood  Reason for consultation: Hypertension   History of Present Illness: Roy [name] Lona is an 57 y.o. male with [name] hx of HTN [facility], c/o an abscess on his L posterior thigh and refill of his BP medications. Abscess has been present for about 1 week, which has been draining pinkish fluid. Of note, pt reported not taking any BP meds for about 6 months as he ran out and wasn't able to fill it. Pt denies any fever/chills, chest pain, SOB, abdominal pain, N/V/D/C, headache, blurry vision, dizziness. In the urgent care, abscess couldn't be drained due to complexity. Also noted was [name] BP of 140/120 and HR of 124. Pt was admitted for further management and [facility] was consulted for management of HTN.    Review of Systems:  ROS  Review of systems are otherwise negative except those mentioned above    Past Medical History: Past Medical History:  Diagnosis Date  . Abscess 02/27/2017   LEFT THIGH  . Hypertension     Past Surgical History: Past Surgical History:  Procedure Laterality Date  . JOINT REPLACEMENT       Allergies:  No Known Allergies   Social History:  reports that  has never smoked. he has never used smokeless tobacco. He reports that he drinks alcohol. He reports that he does not use drugs.   Family History: History reviewed. No pertinent family history.   Physical Exam: Vitals:   02/27/17 1727 02/27/17 2118  BP: (!) 185/99 (!) 183/93  Pulse: (!) 109 90  Resp: 18 18  Temp: 98.2 F (36.8 C) 99.5 F (37.5 C)  TempSrc: Oral Oral  SpO2: 100% 100%  Weight: 105.2 kg (232 lb)   Height: 6\' 2"  (1.88 m)     Constitutional: Alert, awake, oriented  Eyes: Negative ENMT: Negative Neck: Negative CVS: S1, S2 present, no added heart  sound Respiratory: Chest clear bilaterally Abdomen: Soft, not-tender, non-distended, BS present Musculoskeletal: Left upper post thigh, >5 cm area of erythema, not drainage, induration present Neuro: No neurologic deficit noted Psych: Normal Skin: Normal except as above   Data reviewed:  I have personally reviewed following labs and imaging studies Labs:  CBC: Recent Labs  Lab 02/27/17 1811  WBC 13.8*  NEUTROABS 9.7*  HGB 14.0  HCT 42.2  MCV 88.3  PLT 364    Basic Metabolic Panel: Recent Labs  Lab 02/27/17 1811  NA 137  K 4.6  CL 98*  CO2 27  GLUCOSE 105*  BUN 24*  CREATININE 1.52*  CALCIUM 9.4   GFR Estimated Creatinine Clearance: 69.3 mL/min ([name]) (by C-G formula based on SCr of 1.52 mg/dL (H)). Liver Function Tests: Recent Labs  Lab 02/27/17 1811  AST 31  ALT 33  ALKPHOS 98  BILITOT 0.5  PROT 8.1  ALBUMIN 3.5   No results for input(s): LIPASE, AMYLASE in the last 168 hours. No results for input(s): AMMONIA in the last 168 hours. Coagulation profile No results for input(s): INR, PROTIME in the last 168 hours.  Cardiac Enzymes: No results for input(s): CKTOTAL, CKMB, CKMBINDEX, TROPONINI in the last 168 hours. BNP: Invalid input(s): POCBNP CBG: No results for input(s): GLUCAP in the last 168 hours. D-Dimer No results  for input(s): DDIMER in the last 72 hours. Hgb A1c Recent Labs    02/27/17 1811  HGBA1C 6.0*   Lipid Profile Recent Labs    02/27/17 1811  CHOL 166  HDL 38*  LDLCALC 104*  TRIG 122  CHOLHDL 4.4   Thyroid function studies Recent Labs    02/27/17 1811  TSH 2.250   Anemia work up No results for input(s): VITAMINB12, FOLATE, FERRITIN, TIBC, IRON, RETICCTPCT in the last 72 hours. Urinalysis No results found for: COLORURINE, APPEARANCEUR, LABSPEC, PHURINE, GLUCOSEU, HGBUR, BILIRUBINUR, KETONESUR, PROTEINUR, UROBILINOGEN, NITRITE, LEUKOCYTESUR   Microbiology No results found for this or any previous visit (from the past  240 hour(s)).     Inpatient Medications:   Scheduled Meds: . [START ON 02/28/2017] Influenza vac split quadrivalent PF  0.5 mL Intramuscular Tomorrow-1000  . nebivolol  2.5 mg Oral Daily   Continuous Infusions: . sodium chloride 100 mL/hr at 02/27/17 1953  . piperacillin-tazobactam (ZOSYN)  IV 3.375 g (02/27/17 1953)     Radiological Exams on Admission: No results found.  Impression/Recommendations Active Problems:   Abscess of left thigh  Hypertension: Uncontrolled Likely due to non-compliance to meds Was prescribed lisinopril/HCT, bystolic in urgent care Due to AKI, will hold off on lisinopril/HCT, continue bystolic and start IV hydralazine prn and amlodipine  AKI IVF, avoid nephrotoxics Daily BMET  Left post thigh abscess IV Zosyn I & D per Gen surg  Thank you for this consultation.  Our [facility] hospitalist team will follow the patient with you.     Briant Cedar M.D. [facility] [email or web address] Password [facility]  02/27/2017, 10:21 PM

## 2017-02-27 NOTE — H&P (Signed)
Roy [name] Documented: 02/27/2017 2:29 PM Location: [facility] Patient #: [id number] DOB: [birth year] Married / Language: Undefined / Race: Black or African American Male   History of Present Illness  The patient is a 57 year old male who presents with a subcutaneous abscess. [facility], 02/26/17, for a refill on his blood pressure medications which he has not taken in about 6-8 months. He also was complaining of a mass on his left posterior thigh which has been present for about a week and draining for about 5 days. He is here with his mother who states the drainage has been bloody/pink and denies seeing obvious pus. He denies fever/chills, nausea/vomiting. He was given doxycycline, which she has taken 2-3 doses so far.   He states he has never had an abscess like this before. His blood pressure yesterday was 180/120 and pulse was 114. He does not know if he has any other medical conditions such as diabetes. He does not take any other medicines and does not smoke. His PCP is Dr. Parke SimmersBland.   Past Surgical History  Hip Surgery  Bilateral.  Diagnostic Studies History  Colonoscopy  never  Allergies  No Known Drug Allergies [02/27/2017]:  Medication History Aspir-Trin (325MG  Tablet DR, Oral) Active. Co-Enzyme Q-10 (50MG  Capsule, Oral) Active. Doxycycline Hyclate (20MG  Tablet, Oral) Active. Voltaren (75MG  Tablet DR, Oral) Active. Lisinopril-Hydrochlorothiazide (10-12.5MG  Tablet, Oral) Active. Bystolic (2.5MG  Tablet, Oral) Active. Omega-3 (300MG  Capsule, Oral) Active. Multiple Vitamin (1 (one) Oral) Active. Medications Reconciled  Social History  Alcohol use  Occasional alcohol use. Caffeine use  Carbonated beverages, Coffee, Tea. Tobacco use  Former smoker.  Family History  Alcohol Abuse  Father. Hypertension  Father.  Other Problems  High blood pressure    Review of Systems  General Not Present-  Appetite Loss, Chills, Fatigue, Fever, Night Sweats, Weight Gain and Weight Loss. HEENT Not Present- Earache, Hearing Loss, Hoarseness, Nose Bleed, Oral Ulcers, Ringing in the Ears, Seasonal Allergies, Sinus Pain, Sore Throat, Visual Disturbances, Wears glasses/contact lenses and Yellow Eyes. Cardiovascular Not Present- Chest Pain, Difficulty Breathing Lying Down, Leg Cramps, Palpitations, Rapid Heart Rate, Shortness of Breath and Swelling of Extremities. Gastrointestinal Not Present- Abdominal Pain, Bloating, Bloody Stool, Change in Bowel Habits, Chronic diarrhea, Constipation, Difficulty Swallowing, Excessive gas, Gets full quickly at meals, Hemorrhoids, Indigestion, Nausea, Rectal Pain and Vomiting. Male Genitourinary Present- Nocturia. Not Present- Blood in Urine, Change in Urinary Stream, Frequency, Impotence, Painful Urination, Urgency and Urine Leakage.  Vitals  02/27/2017 2:34 PM Weight: 329.8 lb Height: 74in Body Surface Area: 2.69 m Body Mass Index: 42.34 kg/m  Temp.: 97.55F  Pulse: 120 (Regular)  BP: 140/120 (Sitting, Left Arm, Standard)   Physical Exam  GENERAL: Obsese African-American male in no acute distress  EYES: No scleral icterus Pupils equal, lids normal  EXTERNAL EARS: Intact, no masses or lesions EXTERNAL NOSE: Intact, no masses or lesions MOUTH: Lips - no lesions Dentition - normal for age  RESPIRATORY: Normal effort, no use of accessory muscles  MUSCULOSKELETAL: Normal gait Grossly normal ROM upper extremities Grossly normal ROM lower extremities  SKIN: Warm and dry Not diaphoretic  PSYCHIATRIC: Normal judgement and insight Normal mood and affect Alert, oriented x 3  Integumentary Left upper posterior thigh: 9 cm area of erythema and 2 areas of central fluctuance, that are not actively draining There is a large area of induration inferior to the mass *Picture from yesterday is in Epic*  I used a cotton-tipped  applicator to  probe the area and try to allow for spontaneous drainage, but the tissue was too tough to easily open.  I asked Dr. Derrell Lolling to evaluate the patient.   Assessment & Plan  ABSCESS OF LEFT THIGH (L02.416) He is presenting with one-week history of worsening and enlarging left posterior thigh abscess/mass that can not be adequately drained in the office. He is currently hypertensive at 140/120 and tachycardic at 124. Due to these additional issues, Dr. Derrell Lolling also evaluated the patient and recommended admission to the hospital with incision and drainage in the OR. He recommends that we consult the medicine service as well to evaluate for any chronic medical conditions such as diabetes. I notified the DOW physician and PA. He will go to [facility] now.  He was told to stay NPO and provided verbal understanding.   Signed electronically by Tsosie Billing, PA C (02/27/2017 4:29 PM)

## 2017-02-27 NOTE — H&P (Signed)
Roy [name] Documented: 02/27/2017 2:29 PM Location: [facility] Patient #: [id number] DOB: 02-12-1960 Married / Language: Undefined / Race: Black or African American Male   History of Present Illness  The patient is a 57 year old male who presents with a subcutaneous abscess. He was seen at [facility] yesterday, 02/26/17, for a refill on his blood pressure medications which he has not taken in about 6-8 months. He also was complaining of a mass on his left posterior thigh which has been present for about a week and draining for about 5 days. He is here with his mother who states the drainage has been bloody/pink and denies seeing obvious pus. He denies fever/chills, nausea/vomiting. He was given doxycycline, which she has taken 2-3 doses so far.   He states he has never had an abscess like this before. His blood pressure yesterday was 180/120 and pulse was 114. He does not know if he has any other medical conditions such as diabetes. He does not take any other medicines and does not smoke. His PCP is Dr. Parke Simmers.   Past Surgical History Hip Surgery  Bilateral.  Diagnostic Studies History  Colonoscopy  never  Allergies  No Known Drug Allergies [02/27/2017]:  Medication History  Aspir-Trin (325MG  Tablet DR, Oral) Active. Co-Enzyme Q-10 (50MG  Capsule, Oral) Active. Doxycycline Hyclate (20MG  Tablet, Oral) Active. Voltaren (75MG  Tablet DR, Oral) Active. Lisinopril-Hydrochlorothiazide (10-12.5MG  Tablet, Oral) Active. Bystolic (2.5MG  Tablet, Oral) Active. Omega-3 (300MG  Capsule, Oral) Active. Multiple Vitamin (1 (one) Oral) Active. Medications Reconciled  Social History   Alcohol use  Occasional alcohol use. Caffeine use  Carbonated beverages, Coffee, Tea. Tobacco use  Former smoker.  Family History  Alcohol Abuse  Father. Hypertension  Father.  Other Problems  High blood pressure    Review of Systems  General Not Present-  Appetite Loss, Chills, Fatigue, Fever, Night Sweats, Weight Gain and Weight Loss. HEENT Not Present- Earache, Hearing Loss, Hoarseness, Nose Bleed, Oral Ulcers, Ringing in the Ears, Seasonal Allergies, Sinus Pain, Sore Throat, Visual Disturbances, Wears glasses/contact lenses and Yellow Eyes. Cardiovascular Not Present- Chest Pain, Difficulty Breathing Lying Down, Leg Cramps, Palpitations, Rapid Heart Rate, Shortness of Breath and Swelling of Extremities. Gastrointestinal Not Present- Abdominal Pain, Bloating, Bloody Stool, Change in Bowel Habits, Chronic diarrhea, Constipation, Difficulty Swallowing, Excessive gas, Gets full quickly at meals, Hemorrhoids, Indigestion, Nausea, Rectal Pain and Vomiting. Male Genitourinary Present- Nocturia. Not Present- Blood in Urine, Change in Urinary Stream, Frequency, Impotence, Painful Urination, Urgency and Urine Leakage.  Vitals 02/27/2017 2:34 PM Weight: 329.8 lb Height: 74in Body Surface Area: 2.69 m Body Mass Index: 42.34 kg/m  Temp.: 97.82F  Pulse: 120 (Regular)  BP: 140/120 (Sitting, Left Arm, Standard)   Physical Exam GENERAL: Obsese African-American male in no acute distress  EYES: No scleral icterus Pupils equal, lids normal  EXTERNAL EARS: Intact, no masses or lesions EXTERNAL NOSE: Intact, no masses or lesions MOUTH: Lips - no lesions Dentition - normal for age  RESPIRATORY: Normal effort, no use of accessory muscles  MUSCULOSKELETAL: Normal gait Grossly normal ROM upper extremities Grossly normal ROM lower extremities  SKIN: Warm and dry Not diaphoretic  PSYCHIATRIC: Normal judgement and insight Normal mood and affect Alert, oriented x 3  Integumentary Note: Left upper posterior thigh: 9 cm area of erythema and 2 areas of central fluctuance, that are not actively draining There is a large area of induration inferior to the mass *Picture from yesterday is in Epic*  I used a cotton-tipped  applicator to  try to allow for drainage, but the tissue was too tough to easily open   Assessment & Plan ABSCESS OF LEFT THIGH (L02.416) Impression: He is presenting with one-week history of worsening and enlarging left posterior thigh abscess/mass which will not be adequately drained in the office. He is currently hypertensive at 140/120 and tachycardic at 124. Due to these additional issues, Dr. Derrell LollingIngram also evaluated the patient and recommended admission to the hospital with incision and drainage in the OR. He recommends that we consult the medicine service as well to evaluate for any chronic medical conditions such as diabetes. I notified the DOW physician and PA. [facility] He was told to stay NPO and provided verbal understanding.  Signed electronically by Tsosie BillingPuja G Keighan Amezcua, PA C (02/27/2017 4:29 PM)

## 2017-02-27 NOTE — H&P (Signed)
Roy [name] Documented: 02/27/2017 2:29 PM Location: [facility] Patient #: [id number] DOB: 02-12-1960 Married / Language: Undefined / Race: Black or African American Male   History of Present Illness  The patient is a 57 year old male who presents with a subcutaneous abscess. He was seen at [facility] yesterday, 02/26/17, for a refill on his blood pressure medications which he has not taken in about 6-8 months. He also was complaining of a mass on his left posterior thigh which has been present for about a week and draining for about 5 days. He is here with his mother who states the drainage has been bloody/pink and denies seeing obvious pus. He denies fever/chills, nausea/vomiting. He was given doxycycline, which she has taken 2-3 doses so far.   He states he has never had an abscess like this before. His blood pressure yesterday was 180/120 and pulse was 114. He does not know if he has any other medical conditions such as diabetes. He does not take any other medicines and does not smoke. His PCP is Dr. Parke Simmers.   Past Surgical History Hip Surgery  Bilateral.  Diagnostic Studies History  Colonoscopy  never  Allergies  No Known Drug Allergies [02/27/2017]:  Medication History  Aspir-Trin (325MG  Tablet DR, Oral) Active. Co-Enzyme Q-10 (50MG  Capsule, Oral) Active. Doxycycline Hyclate (20MG  Tablet, Oral) Active. Voltaren (75MG  Tablet DR, Oral) Active. Lisinopril-Hydrochlorothiazide (10-12.5MG  Tablet, Oral) Active. Bystolic (2.5MG  Tablet, Oral) Active. Omega-3 (300MG  Capsule, Oral) Active. Multiple Vitamin (1 (one) Oral) Active. Medications Reconciled  Social History   Alcohol use  Occasional alcohol use. Caffeine use  Carbonated beverages, Coffee, Tea. Tobacco use  Former smoker.  Family History  Alcohol Abuse  Father. Hypertension  Father.  Other Problems  High blood pressure    Review of Systems  General Not Present-  Appetite Loss, Chills, Fatigue, Fever, Night Sweats, Weight Gain and Weight Loss. HEENT Not Present- Earache, Hearing Loss, Hoarseness, Nose Bleed, Oral Ulcers, Ringing in the Ears, Seasonal Allergies, Sinus Pain, Sore Throat, Visual Disturbances, Wears glasses/contact lenses and Yellow Eyes. Cardiovascular Not Present- Chest Pain, Difficulty Breathing Lying Down, Leg Cramps, Palpitations, Rapid Heart Rate, Shortness of Breath and Swelling of Extremities. Gastrointestinal Not Present- Abdominal Pain, Bloating, Bloody Stool, Change in Bowel Habits, Chronic diarrhea, Constipation, Difficulty Swallowing, Excessive gas, Gets full quickly at meals, Hemorrhoids, Indigestion, Nausea, Rectal Pain and Vomiting. Male Genitourinary Present- Nocturia. Not Present- Blood in Urine, Change in Urinary Stream, Frequency, Impotence, Painful Urination, Urgency and Urine Leakage.  Vitals 02/27/2017 2:34 PM Weight: 329.8 lb Height: 74in Body Surface Area: 2.69 m Body Mass Index: 42.34 kg/m  Temp.: 97.82F  Pulse: 120 (Regular)  BP: 140/120 (Sitting, Left Arm, Standard)   Physical Exam GENERAL: Obsese African-American male in no acute distress  EYES: No scleral icterus Pupils equal, lids normal  EXTERNAL EARS: Intact, no masses or lesions EXTERNAL NOSE: Intact, no masses or lesions MOUTH: Lips - no lesions Dentition - normal for age  RESPIRATORY: Normal effort, no use of accessory muscles  MUSCULOSKELETAL: Normal gait Grossly normal ROM upper extremities Grossly normal ROM lower extremities  SKIN: Warm and dry Not diaphoretic  PSYCHIATRIC: Normal judgement and insight Normal mood and affect Alert, oriented x 3  Integumentary Note: Left upper posterior thigh: 9 cm area of erythema and 2 areas of central fluctuance, that are not actively draining There is a large area of induration inferior to the mass *Picture from yesterday is in Epic*  I used a cotton-tipped  applicator to  try to allow for drainage, but the tissue was too tough to easily open   Assessment & Plan ABSCESS OF LEFT THIGH (L02.416) Impression: He is presenting with one-week history of worsening and enlarging left posterior thigh abscess/mass which will not be adequately drained in the office. He is currently hypertensive at 140/120 and tachycardic at 124. Due to these additional issues, Dr. Derrell LollingIngram also evaluated the patient and recommended admission to the hospital with incision and drainage in the OR. He recommends that we consult the medicine service as well to evaluate for any chronic medical conditions such as diabetes. I notified the DOW physician and PA. [facility] He was told to stay NPO and provided verbal understanding.  Signed electronically by Tsosie BillingPuja G Atziry Baranski, PA C (02/27/2017 4:29 PM)

## 2017-02-28 ENCOUNTER — Other Ambulatory Visit: Payer: Self-pay

## 2017-02-28 ENCOUNTER — Observation Stay (HOSPITAL_COMMUNITY): Payer: BLUE CROSS/BLUE SHIELD

## 2017-02-28 ENCOUNTER — Observation Stay (HOSPITAL_COMMUNITY): Payer: BLUE CROSS/BLUE SHIELD | Admitting: Anesthesiology

## 2017-02-28 ENCOUNTER — Encounter (HOSPITAL_COMMUNITY): Payer: Self-pay | Admitting: *Deleted

## 2017-02-28 ENCOUNTER — Encounter (HOSPITAL_COMMUNITY): Admission: AD | Disposition: A | Discharge status: Home / Self Care | Payer: Self-pay | Source: Ambulatory Visit

## 2017-02-28 DIAGNOSIS — Z6841 Body Mass Index (BMI) 40.0 and over, adult: Secondary | ICD-10-CM | POA: Diagnosis not present

## 2017-02-28 DIAGNOSIS — I10 Essential (primary) hypertension: Secondary | ICD-10-CM | POA: Diagnosis not present

## 2017-02-28 DIAGNOSIS — Z87891 Personal history of nicotine dependence: Secondary | ICD-10-CM | POA: Diagnosis not present

## 2017-02-28 DIAGNOSIS — Z79899 Other long term (current) drug therapy: Secondary | ICD-10-CM | POA: Diagnosis not present

## 2017-02-28 DIAGNOSIS — L02416 Cutaneous abscess of left lower limb: Secondary | ICD-10-CM | POA: Diagnosis not present

## 2017-02-28 DIAGNOSIS — Z01818 Encounter for other preprocedural examination: Secondary | ICD-10-CM | POA: Diagnosis not present

## 2017-02-28 DIAGNOSIS — I96 Gangrene, not elsewhere classified: Secondary | ICD-10-CM | POA: Diagnosis not present

## 2017-02-28 DIAGNOSIS — N179 Acute kidney failure, unspecified: Secondary | ICD-10-CM | POA: Diagnosis not present

## 2017-02-28 HISTORY — PX: IRRIGATION AND DEBRIDEMENT ABSCESS: SHX5252

## 2017-02-28 LAB — CBC
HCT: 41 % (ref 39.0–52.0)
HEMOGLOBIN: 13.2 g/dL (ref 13.0–17.0)
MCH: 28.4 pg (ref 26.0–34.0)
MCHC: 32.2 g/dL (ref 30.0–36.0)
MCV: 88.4 fL (ref 78.0–100.0)
PLATELETS: 324 10*3/uL (ref 150–400)
RBC: 4.64 MIL/uL (ref 4.22–5.81)
RDW: 13.3 % (ref 11.5–15.5)
WBC: 11 10*3/uL — ABNORMAL HIGH (ref 4.0–10.5)

## 2017-02-28 LAB — BASIC METABOLIC PANEL
ANION GAP: 13 (ref 5–15)
BUN: 18 mg/dL (ref 6–20)
CALCIUM: 9.2 mg/dL (ref 8.9–10.3)
CO2: 22 mmol/L (ref 22–32)
Chloride: 103 mmol/L (ref 101–111)
Creatinine, Ser: 1.24 mg/dL (ref 0.61–1.24)
Glucose, Bld: 107 mg/dL — ABNORMAL HIGH (ref 65–99)
Potassium: 3.6 mmol/L (ref 3.5–5.1)
SODIUM: 138 mmol/L (ref 135–145)

## 2017-02-28 LAB — SURGICAL PCR SCREEN
MRSA, PCR: NEGATIVE
STAPHYLOCOCCUS AUREUS: POSITIVE — AB

## 2017-02-28 LAB — HIV ANTIBODY (ROUTINE TESTING W REFLEX): HIV Screen 4th Generation wRfx: NONREACTIVE

## 2017-02-28 SURGERY — IRRIGATION AND DEBRIDEMENT ABSCESS
Anesthesia: General | Site: Thigh | Laterality: Left

## 2017-02-28 MED ORDER — 0.9 % SODIUM CHLORIDE (POUR BTL) OPTIME
TOPICAL | Status: DC | PRN
  Administered 2017-02-28: 1000 mL

## 2017-02-28 MED ORDER — LACTATED RINGERS IV SOLN
INTRAVENOUS | Status: DC | PRN
  Administered 2017-02-28 (×2): via INTRAVENOUS

## 2017-02-28 MED ORDER — PROMETHAZINE HCL 25 MG/ML IJ SOLN: 6.2500 mg | INTRAMUSCULAR | Status: DC | PRN

## 2017-02-28 MED ORDER — FENTANYL CITRATE (PF) 250 MCG/5ML IJ SOLN
INTRAMUSCULAR | Status: AC
  Filled 2017-02-28: qty 5

## 2017-02-28 MED ORDER — MIDAZOLAM HCL 2 MG/2ML IJ SOLN
INTRAMUSCULAR | Status: DC | PRN
  Administered 2017-02-28: 1 mg via INTRAVENOUS

## 2017-02-28 MED ORDER — PHENYLEPHRINE 40 MCG/ML (10ML) SYRINGE FOR IV PUSH (FOR BLOOD PRESSURE SUPPORT)
PREFILLED_SYRINGE | INTRAVENOUS | Status: DC | PRN
  Administered 2017-02-28: 80 ug via INTRAVENOUS
  Administered 2017-02-28: 320 ug via INTRAVENOUS

## 2017-02-28 MED ORDER — HYDROMORPHONE HCL 1 MG/ML IJ SOLN: 0.2500 mg | INTRAMUSCULAR | Status: DC | PRN

## 2017-02-28 MED ORDER — PROPOFOL 10 MG/ML IV BOLUS
INTRAVENOUS | Status: AC
  Filled 2017-02-28: qty 20

## 2017-02-28 MED ORDER — PROPOFOL 10 MG/ML IV BOLUS
INTRAVENOUS | Status: DC | PRN
  Administered 2017-02-28: 180 mg via INTRAVENOUS

## 2017-02-28 MED ORDER — SUCCINYLCHOLINE CHLORIDE 200 MG/10ML IV SOSY
PREFILLED_SYRINGE | INTRAVENOUS | Status: DC | PRN
  Administered 2017-02-28: 140 mg via INTRAVENOUS

## 2017-02-28 MED ORDER — PIPERACILLIN-TAZOBACTAM 3.375 G IVPB 30 MIN
3.3750 g | INTRAVENOUS | Status: AC
  Filled 2017-02-28: qty 50

## 2017-02-28 MED ORDER — NEBIVOLOL HCL 10 MG PO TABS
10.0000 mg | ORAL_TABLET | Freq: Every day | ORAL | Status: DC
  Administered 2017-02-28 – 2017-03-01 (×2): 10 mg via ORAL
  Filled 2017-02-28 (×2): qty 1

## 2017-02-28 MED ORDER — MIDAZOLAM HCL 2 MG/2ML IJ SOLN
INTRAMUSCULAR | Status: AC
  Filled 2017-02-28: qty 2

## 2017-02-28 MED ORDER — LACTATED RINGERS IV SOLN
INTRAVENOUS | Status: DC
  Administered 2017-02-28: 09:00:00 via INTRAVENOUS

## 2017-02-28 MED ORDER — AMLODIPINE BESYLATE 10 MG PO TABS
10.0000 mg | ORAL_TABLET | Freq: Every day | ORAL | Status: DC
  Administered 2017-02-28 – 2017-03-01 (×2): 10 mg via ORAL
  Filled 2017-02-28 (×2): qty 1

## 2017-02-28 MED ORDER — PHENYLEPHRINE HCL 10 MG/ML IJ SOLN
INTRAVENOUS | Status: DC | PRN
  Administered 2017-02-28: 50 ug/min via INTRAVENOUS

## 2017-02-28 MED ORDER — DEXAMETHASONE SODIUM PHOSPHATE 10 MG/ML IJ SOLN
INTRAMUSCULAR | Status: DC | PRN
  Administered 2017-02-28: 10 mg via INTRAVENOUS

## 2017-02-28 MED ORDER — LIDOCAINE 2% (20 MG/ML) 5 ML SYRINGE
INTRAMUSCULAR | Status: DC | PRN
  Administered 2017-02-28: 100 mg via INTRAVENOUS

## 2017-02-28 MED ORDER — ONDANSETRON HCL 4 MG/2ML IJ SOLN
INTRAMUSCULAR | Status: DC | PRN
  Administered 2017-02-28: 4 mg via INTRAVENOUS

## 2017-02-28 MED ORDER — VASOPRESSIN 20 UNIT/ML IV SOLN
INTRAVENOUS | Status: AC
  Filled 2017-02-28: qty 1

## 2017-02-28 MED ORDER — EPHEDRINE SULFATE-NACL 50-0.9 MG/10ML-% IV SOSY
PREFILLED_SYRINGE | INTRAVENOUS | Status: DC | PRN
  Administered 2017-02-28: 20 mg via INTRAVENOUS
  Administered 2017-02-28: 25 mg via INTRAVENOUS

## 2017-02-28 MED ORDER — FENTANYL CITRATE (PF) 100 MCG/2ML IJ SOLN
INTRAMUSCULAR | Status: DC | PRN
  Administered 2017-02-28: 100 ug via INTRAVENOUS
  Administered 2017-02-28: 50 ug via INTRAVENOUS

## 2017-02-28 SURGICAL SUPPLY — 30 items
BLADE CLIPPER SURG (BLADE) IMPLANT
BNDG GAUZE ELAST 4 BULKY (GAUZE/BANDAGES/DRESSINGS) ×3 IMPLANT
CANISTER SUCT 3000ML PPV (MISCELLANEOUS) ×3 IMPLANT
COVER SURGICAL LIGHT HANDLE (MISCELLANEOUS) ×3 IMPLANT
DRAPE LAPAROSCOPIC ABDOMINAL (DRAPES) IMPLANT
DRAPE LAPAROTOMY 100X72 PEDS (DRAPES) ×3 IMPLANT
DRSG PAD ABDOMINAL 8X10 ST (GAUZE/BANDAGES/DRESSINGS) ×3 IMPLANT
ELECT CAUTERY BLADE 6.4 (BLADE) ×3 IMPLANT
ELECT REM PT RETURN 9FT ADLT (ELECTROSURGICAL) ×3
ELECTRODE REM PT RTRN 9FT ADLT (ELECTROSURGICAL) ×1 IMPLANT
GAUZE SPONGE 4X4 12PLY STRL (GAUZE/BANDAGES/DRESSINGS) IMPLANT
GLOVE BIOGEL PI IND STRL 7.5 (GLOVE) ×1 IMPLANT
GLOVE BIOGEL PI INDICATOR 7.5 (GLOVE) ×2
GLOVE SURG SS PI 7.0 STRL IVOR (GLOVE) ×3 IMPLANT
GOWN STRL REUS W/ TWL LRG LVL3 (GOWN DISPOSABLE) ×2 IMPLANT
GOWN STRL REUS W/TWL LRG LVL3 (GOWN DISPOSABLE) ×4
KIT BASIN OR (CUSTOM PROCEDURE TRAY) ×3 IMPLANT
KIT ROOM TURNOVER OR (KITS) ×3 IMPLANT
NEEDLE 22X1 1/2 (OR ONLY) (NEEDLE) ×3 IMPLANT
NS IRRIG 1000ML POUR BTL (IV SOLUTION) ×3 IMPLANT
PACK SURGICAL SETUP 50X90 (CUSTOM PROCEDURE TRAY) ×3 IMPLANT
PAD ARMBOARD 7.5X6 YLW CONV (MISCELLANEOUS) ×3 IMPLANT
PENCIL BUTTON HOLSTER BLD 10FT (ELECTRODE) ×3 IMPLANT
SWAB COLLECTION DEVICE MRSA (MISCELLANEOUS) ×3 IMPLANT
SWAB CULTURE ESWAB REG 1ML (MISCELLANEOUS) ×3 IMPLANT
TOWEL OR 17X24 6PK STRL BLUE (TOWEL DISPOSABLE) ×3 IMPLANT
TOWEL OR 17X26 10 PK STRL BLUE (TOWEL DISPOSABLE) ×3 IMPLANT
TUBE CONNECTING 12'X1/4 (SUCTIONS) ×1
TUBE CONNECTING 12X1/4 (SUCTIONS) ×2 IMPLANT
YANKAUER SUCT BULB TIP NO VENT (SUCTIONS) ×3 IMPLANT

## 2017-02-28 NOTE — Progress Notes (Signed)
  Progress Note: General Surgery Service   Assessment/Plan: Patient Active Problem List   Diagnosis Date Noted  . Abscess of left thigh 02/27/2017   -OR today for I+D -IV abx   LOS: 0 days  Chief Complaint/Subjective: Pain in left thigh  Objective: Vital signs in last 24 hours: Temp:  [98.2 F (36.8 C)-99.5 F (37.5 C)] 98.8 F (37.1 C) (01/30 0524) Pulse Rate:  [86-109] 93 (01/30 0524) Resp:  [18] 18 (01/30 0524) BP: (177-194)/(86-111) 177/86 (01/30 0655) SpO2:  [100 %] 100 % (01/30 0524) Weight:  [105.2 kg (232 lb)] 105.2 kg (232 lb) (01/29 1727) Last BM Date: 02/27/17  Intake/Output from previous day: 01/29 0701 - 01/30 0700 In: -  Out: 730 [Urine:730] Intake/Output this shift: No intake/output data recorded.  Lungs: CTAB  Cardiovascular: RRR  Abd: soft, NT, ND  Extremities: left thigh abscess with white area of skin with purulent drainage  Neuro: AOx4  Lab Results: CBC  Recent Labs    02/27/17 1811  WBC 13.8*  HGB 14.0  HCT 42.2  PLT 364   BMET Recent Labs    02/27/17 1811  NA 137  K 4.6  CL 98*  CO2 27  GLUCOSE 105*  BUN 24*  CREATININE 1.52*  CALCIUM 9.4   PT/INR No results for input(s): LABPROT, INR in the last 72 hours. ABG No results for input(s): PHART, HCO3 in the last 72 hours.  Invalid input(s): PCO2, PO2  Studies/Results:  Anti-infectives: Anti-infectives (From admission, onward)   Start     Dose/Rate Route Frequency Ordered Stop   02/27/17 1900  piperacillin-tazobactam (ZOSYN) IVPB 3.375 g     3.375 g 12.5 mL/hr over 240 Minutes Intravenous Every 8 hours 02/27/17 1727        Medications: Scheduled Meds: . amLODipine  5 mg Oral Daily  . Influenza vac split quadrivalent PF  0.5 mL Intramuscular Tomorrow-1000  . nebivolol  2.5 mg Oral Daily   Continuous Infusions: . sodium chloride 100 mL/hr at 02/28/17 0532  . piperacillin-tazobactam (ZOSYN)  IV Stopped (02/28/17 0631)   PRN Meds:.diphenhydrAMINE **OR**  diphenhydrAMINE, hydrALAZINE, morphine injection  Rodman PickleLuke Aaron Kinsinger, MD Pg# [phone] [facility]

## 2017-02-28 NOTE — Anesthesia Postprocedure Evaluation (Signed)
Anesthesia Post Note  Patient: Roy [name]  Procedure(s) Performed: IRRIGATION AND DEBRIDEMENT ABSCESS/THIGH (Left Thigh)     Patient location during evaluation: PACU Anesthesia Type: General Level of consciousness: sedated Pain management: pain level controlled Vital Signs Assessment: post-procedure vital signs reviewed and stable Respiratory status: spontaneous breathing and respiratory function stable Cardiovascular status: stable Postop Assessment: no apparent nausea or vomiting Anesthetic complications: no    Last Vitals:  Vitals:   02/28/17 1229 02/28/17 1244  BP: 122/82 136/80  Pulse: 92 92  Resp: 17 18  Temp: 37.1 C 37 C  SpO2: 96% 95%    Last Pain:  Vitals:   02/28/17 1244  TempSrc: Oral  PainSc: 0-No pain                 Londell Noll DANIEL

## 2017-02-28 NOTE — Progress Notes (Addendum)
[facility]- Consult Note  Consulted for HTN. Patient in OR and not evaluated today.    BP quite high this AM.  Renal function has improved   Plan:  - Increase Norvasc to 10 mg and increase Bystolic to 10 mg. Repeat Bmet to f/u on BUN/ Cr. - Cont PRN Hydralazine - Cr is improved today- If Cr still stable tomorrow, can consider resuming Lisinopril/ HCTZ if BP remains uncontrolled.       Calvert CantorSaima Lysandra Loughmiller, MD

## 2017-02-28 NOTE — Op Note (Signed)
Preoperative diagnosis: left thigh abscess  Postoperative diagnosis: same   Procedure: excisional debridement of 3x6cm full thickness necrotic tissue and drainage of abscess  Surgeon: Feliciana RossettiLuke Taraann Olthoff, M.D.  Asst: none  Anesthesia: general  Indications for procedure: Roy GettingKirk A Walther is a 57 y.o. year old male with symptoms of pain and drainage from left thigh.  Description of procedure: The patient was brought into the operative suite. Anesthesia was administered with General LMA anesthesia. WHO checklist was applied. The patient was then placed in lateral decubitus position. The area was prepped and draped in the usual sterile fashion.  Cultures were taken from the center of the area. Next, cautery was used to debride the necrotic skin of the area, there was a liquefactive necrosis of the subcutaneous tissue down to the level of the muscle, this tissues was removed. A curette was used to debride further sharply. Cautery was then used for hemostasis. The wound bed was irrigated and a salinated gauze was packed into the wound  Findings: necrotic tissue of subcutaneous left thigh wound  Specimen: culture of left thigh abscess  Implant: kerlex   Blood loss: <3750ml  Local anesthesia: none  Complications: none  Feliciana RossettiLuke Alexandrya Chim, M.D. General, Bariatric, & Minimally Invasive Surgery [facility]

## 2017-02-28 NOTE — Anesthesia Procedure Notes (Signed)
Procedure Name: Intubation Date/Time: 02/28/2017 11:09 AM Performed by: Imagene Riches, CRNA Pre-anesthesia Checklist: Patient identified, Emergency Drugs available, Suction available and Patient being monitored Patient Re-evaluated:Patient Re-evaluated prior to induction Oxygen Delivery Method: Circle System Utilized Preoxygenation: Pre-oxygenation with 100% oxygen Induction Type: IV induction Laryngoscope Size: Mac and 4 Grade View: Grade I Tube type: Oral Tube size: 7.5 mm Number of attempts: 1 Airway Equipment and Method: Stylet and Oral airway Placement Confirmation: ETT inserted through vocal cords under direct vision,  positive ETCO2 and breath sounds checked- equal and bilateral Secured at: 23 cm Tube secured with: Tape Dental Injury: Teeth and Oropharynx as per pre-operative assessment

## 2017-02-28 NOTE — Anesthesia Preprocedure Evaluation (Addendum)
Anesthesia Evaluation  Patient identified by MRN, date of birth, ID band Patient awake    Reviewed: Allergy & Precautions, NPO status , Patient's Chart, lab work & pertinent test results, reviewed documented beta blocker date and time   Airway Mallampati: II  TM Distance: >3 FB Neck ROM: Full    Dental no notable dental hx. (+) Dental Advisory Given, Missing   Pulmonary neg pulmonary ROS,    Pulmonary exam normal        Cardiovascular hypertension, Pt. on medications and Pt. on home beta blockers negative cardio ROS Normal cardiovascular exam     Neuro/Psych negative neurological ROS  negative psych ROS   GI/Hepatic negative GI ROS, Neg liver ROS,   Endo/Other  Morbid obesity  Renal/GU negative Renal ROS  negative genitourinary   Musculoskeletal negative musculoskeletal ROS (+)   Abdominal   Peds negative pediatric ROS (+)  Hematology negative hematology ROS (+)   Anesthesia Other Findings   Reproductive/Obstetrics negative OB ROS                             Anesthesia Physical Anesthesia Plan  ASA: III  Anesthesia Plan: General   Post-op Pain Management:    Induction: Intravenous  PONV Risk Score and Plan: 2 and Ondansetron and Dexamethasone  Airway Management Planned: LMA  Additional Equipment:   Intra-op Plan:   Post-operative Plan: Extubation in OR  Informed Consent: I have reviewed the patients History and Physical, chart, labs and discussed the procedure including the risks, benefits and alternatives for the proposed anesthesia with the patient or authorized representative who has indicated his/her understanding and acceptance.   Dental advisory given  Plan Discussed with: CRNA and Anesthesiologist  Anesthesia Plan Comments:        Anesthesia Quick Evaluation

## 2017-02-28 NOTE — Transfer of Care (Signed)
Immediate Anesthesia Transfer of Care Note  Patient: Roy [name]  Procedure(s) Performed: IRRIGATION AND DEBRIDEMENT ABSCESS/THIGH (Left Thigh)  Patient Location: PACU  Anesthesia Type:General  Level of Consciousness: patient cooperative and responds to stimulation  Airway & Oxygen Therapy: Patient Spontanous Breathing and Patient connected to nasal cannula oxygen  Post-op Assessment: Report given to RN and Post -op Vital signs reviewed and stable  Post vital signs: Reviewed and stable  Last Vitals:  Vitals:   02/28/17 0754 02/28/17 0854  BP: (!) 146/64 (!) 157/90  Pulse: (!) 116 (!) 112  Resp:  17  Temp: 36.8 C   SpO2: 97% 100%    Last Pain:  Vitals:   02/28/17 0754  TempSrc: Oral  PainSc: 7          Complications: No apparent anesthesia complications

## 2017-03-01 ENCOUNTER — Encounter (HOSPITAL_COMMUNITY): Payer: Self-pay | Admitting: General Surgery

## 2017-03-01 ENCOUNTER — Encounter (HOSPITAL_COMMUNITY): Payer: Self-pay | Admitting: *Deleted

## 2017-03-01 DIAGNOSIS — N179 Acute kidney failure, unspecified: Secondary | ICD-10-CM | POA: Diagnosis not present

## 2017-03-01 DIAGNOSIS — Z6841 Body Mass Index (BMI) 40.0 and over, adult: Secondary | ICD-10-CM | POA: Diagnosis not present

## 2017-03-01 DIAGNOSIS — Z87891 Personal history of nicotine dependence: Secondary | ICD-10-CM | POA: Diagnosis not present

## 2017-03-01 DIAGNOSIS — Z79899 Other long term (current) drug therapy: Secondary | ICD-10-CM | POA: Diagnosis not present

## 2017-03-01 DIAGNOSIS — L02416 Cutaneous abscess of left lower limb: Secondary | ICD-10-CM | POA: Diagnosis not present

## 2017-03-01 DIAGNOSIS — I10 Essential (primary) hypertension: Secondary | ICD-10-CM | POA: Diagnosis not present

## 2017-03-01 DIAGNOSIS — Z01818 Encounter for other preprocedural examination: Secondary | ICD-10-CM | POA: Diagnosis not present

## 2017-03-01 MED ORDER — MORPHINE SULFATE (PF) 4 MG/ML IV SOLN
1.0000 mg | INTRAVENOUS | Status: DC | PRN
  Administered 2017-03-01: 1 mg via INTRAVENOUS
  Filled 2017-03-01: qty 1

## 2017-03-01 MED ORDER — OXYCODONE HCL 5 MG PO TABS: 5.0000 mg | ORAL_TABLET | Freq: Four times a day (QID) | ORAL | 0 refills | Status: DC | PRN

## 2017-03-01 MED ORDER — ACETAMINOPHEN 325 MG PO TABS: 650.0000 mg | ORAL_TABLET | Freq: Four times a day (QID) | ORAL | Status: DC | PRN

## 2017-03-01 MED ORDER — AMOXICILLIN-POT CLAVULANATE 875-125 MG PO TABS: 1.0000 | ORAL_TABLET | Freq: Two times a day (BID) | ORAL | 0 refills | Status: AC

## 2017-03-01 MED ORDER — AMLODIPINE BESYLATE 10 MG PO TABS: 10.0000 mg | ORAL_TABLET | Freq: Every day | ORAL | 0 refills | Status: DC

## 2017-03-01 MED ORDER — OXYCODONE HCL 5 MG PO TABS: 5.0000 mg | ORAL_TABLET | Freq: Four times a day (QID) | ORAL | Status: DC | PRN

## 2017-03-01 NOTE — Progress Notes (Signed)
Discharge paperwork reviewed with patient and family. Prescription given. Work note given. Demonstrated dressing changes to mom, brother and sister. No questions verbalized. Patient is ready to discharge.

## 2017-03-01 NOTE — Progress Notes (Signed)
Medical Consultation   Roy [name]  ZDG:[id number]  DOB: February 18, 1960  DOA: 02/27/2017  PCP: Renaye Rakers, MD   Outpatient Specialists: None   Requesting physician: Dr Claud Kelp  Reason for consultation: Hypertension   History of Present Illness: Roy [name] is an 57 y.o. male with a hx of HTN presents to [facility] urgent care on 02/26/17, c/o an abscess on his L posterior thigh and refill of his BP medications. Abscess has been present for about 1 week, which has been draining pinkish fluid. Of note, pt reported not taking any BP meds for about 6 months as he ran out and wasn't able to fill it. Pt denies any fever/chills, chest pain, SOB, abdominal pain, N/V/D/C, headache, blurry vision, dizziness. In the urgent care, abscess couldn't be drained due to complexity. Also noted was a BP of 140/120 and HR of 124. Pt was admitted for further management and [facility] was consulted for management of HTN.  03/01/17: no new complaints. BP is stable and optimal 126/69 HR 63 RR 18 pox 99% RA    Review of Systems:  ROS  Review of systems are otherwise negative except those mentioned above    Past Medical History: Past Medical History:  Diagnosis Date  . Abscess 02/27/2017   LEFT THIGH  . Hypertension     Past Surgical History: Past Surgical History:  Procedure Laterality Date  . IRRIGATION AND DEBRIDEMENT ABSCESS Left 02/28/2017   Procedure: IRRIGATION AND DEBRIDEMENT ABSCESS/THIGH;  Surgeon: Kinsinger, De Blanch, MD;  Location: [facility] OR;  Service: General;  Laterality: Left;  . JOINT REPLACEMENT       Allergies:  No Known Allergies   Social History:  reports that  has never smoked. he has never used smokeless tobacco. He reports that he drinks alcohol. He reports that he does not use drugs.   Family History: History reviewed. No pertinent family history.   Physical Exam: Vitals:   02/28/17 1244 02/28/17 1410 02/28/17 2022 03/01/17 0606  BP: 136/80 (!) 148/80  140/66 (!) 158/83  Pulse: 92 96 94 86  Resp: 18 18 17 18   Temp: 98.6 F (37 C) 98.2 F (36.8 C) 99.1 F (37.3 C) 98.3 F (36.8 C)  TempSrc: Oral Oral Oral Oral  SpO2: 95% 95% 97% 99%  Weight:      Height:       03/01/17 patient seen and examined at his bedside. Constitutional: Alert, awake, oriented  Eyes: Negative ENMT: Negative Neck: Negative CVS: S1, S2 present, no added heart sound Respiratory: Chest clear bilaterally Abdomen: Soft, not-tender, non-distended, BS present Musculoskeletal: Left upper post thigh, >5 cm area of erythema, not drainage, induration present Neuro: No neurologic deficit noted Psych: Normal Skin: Normal except as above   Data reviewed:  I have personally reviewed following labs and imaging studies Labs:  CBC: Recent Labs  Lab 02/27/17 1811 02/28/17 0735  WBC 13.8* 11.0*  NEUTROABS 9.7*  --   HGB 14.0 13.2  HCT 42.2 41.0  MCV 88.3 88.4  PLT 364 324    Basic Metabolic Panel: Recent Labs  Lab 02/27/17 1811 02/28/17 0735  NA 137 138  K 4.6 3.6  CL 98* 103  CO2 27 22  GLUCOSE 105* 107*  BUN 24* 18  CREATININE 1.52* 1.24  CALCIUM 9.4 9.2   GFR Estimated Creatinine Clearance: 99.8 mL/min (by C-G formula based on SCr of 1.24 mg/dL). Liver Function Tests: Recent  Labs  Lab 02/27/17 1811  AST 31  ALT 33  ALKPHOS 98  BILITOT 0.5  PROT 8.1  ALBUMIN 3.5   No results for input(s): LIPASE, AMYLASE in the last 168 hours. No results for input(s): AMMONIA in the last 168 hours. Coagulation profile No results for input(s): INR, PROTIME in the last 168 hours.  Cardiac Enzymes: No results for input(s): CKTOTAL, CKMB, CKMBINDEX, TROPONINI in the last 168 hours. BNP: Invalid input(s): POCBNP CBG: No results for input(s): GLUCAP in the last 168 hours. D-Dimer No results for input(s): DDIMER in the last 72 hours. Hgb A1c Recent Labs    02/27/17 1811  HGBA1C 6.0*   Lipid Profile Recent Labs    02/27/17 1811  CHOL 166  HDL 38*   LDLCALC 104*  TRIG 122  CHOLHDL 4.4   Thyroid function studies Recent Labs    02/27/17 1811  TSH 2.250   Anemia work up No results for input(s): VITAMINB12, FOLATE, FERRITIN, TIBC, IRON, RETICCTPCT in the last 72 hours. Urinalysis No results found for: COLORURINE, APPEARANCEUR, LABSPEC, PHURINE, GLUCOSEU, HGBUR, BILIRUBINUR, KETONESUR, PROTEINUR, UROBILINOGEN, NITRITE, LEUKOCYTESUR   Microbiology Recent Results (from the past 240 hour(s))  Surgical pcr screen     Status: Abnormal   Collection Time: 02/27/17 11:40 PM  Result Value Ref Range Status   MRSA, PCR NEGATIVE NEGATIVE Final   Staphylococcus aureus POSITIVE (A) NEGATIVE Final    Comment: (NOTE) The Xpert SA Assay (FDA approved for NASAL specimens in patients 57 years of age and older), is one component of a comprehensive surveillance program. It is not intended to diagnose infection nor to guide or monitor treatment.   Aerobic/Anaerobic Culture (surgical/deep wound)     Status: None (Preliminary result)   Collection Time: 02/28/17 11:19 AM  Result Value Ref Range Status   Specimen Description ABSCESS LEFT THIGH  Final   Special Requests NONE  Final   Gram Stain   Final    MODERATE WBC PRESENT, PREDOMINANTLY PMN NO ORGANISMS SEEN    Culture NO GROWTH < 24 HOURS  Final   Report Status PENDING  Incomplete       Inpatient Medications:   Scheduled Meds: . amLODipine  10 mg Oral Daily  . Influenza vac split quadrivalent PF  0.5 mL Intramuscular Tomorrow-1000  . nebivolol  10 mg Oral Daily   Continuous Infusions: . sodium chloride 100 mL/hr at 02/28/17 1247  . lactated ringers Stopped (02/28/17 1247)  . piperacillin-tazobactam (ZOSYN)  IV Stopped (03/01/17 [id number])     Radiological Exams on Admission: Dg Chest 2 View  Result Date: 02/28/2017 CLINICAL DATA:  Preop surgery, left leg abscess EXAM: CHEST  2 VIEW COMPARISON:  None. FINDINGS: Lungs are clear.  No pleural effusion or pneumothorax. The heart is  normal in size. Degenerative changes of the visualized thoracolumbar spine. IMPRESSION: Normal chest radiographs. Electronically Signed   By: Charline BillsSriyesh  Krishnan M.D.   On: 02/28/2017 09:44    Impression/Recommendations Active Problems:   Abscess of left thigh  Hypertension: -Now controlled -Was likely due to non-compliance to meds -conitnue to hold off lisinopril/HCT due to presenting AKI -continue bystolic 10 mg daily and amlodipine 10 mg daily  AKI, resolving IVF, avoid nephrotoxic agents/hypotension/dehydration Daily BMET  Left post thigh abscess IV Zosyn I & D per Gen surg  Obesity -recommend weight loss with regular physical activity and healthy diet -continue PT  Thank you for this consultation.  [facility] hospital[facility] team will follow the patient with you.  Darlin Drop M.D. [facility] [email or web address] Password [email or web address]  03/01/2017, 9:04 AM

## 2017-03-01 NOTE — Discharge Summary (Signed)
[facility] Discharge Summary   Patient ID: [name] MRN: [id number] DOB/AGE: Jun 10, 1960 57 y.o.  Admit date: 02/27/2017 Discharge date: 03/01/2017  Admitting Diagnosis: Left thigh abscess  Discharge Diagnosis Patient Active Problem List   Diagnosis Date Noted  . Abscess of left thigh 02/27/2017    Consultants Internal medicine  Imaging: Dg Chest 2 View  Result Date: 02/28/2017 CLINICAL DATA:  Preop surgery, left leg abscess EXAM: CHEST  2 VIEW COMPARISON:  None. FINDINGS: Lungs are clear.  No pleural effusion or pneumothorax. The heart is normal in size. Degenerative changes of the visualized thoracolumbar spine. IMPRESSION: Normal chest radiographs. Electronically Signed   By: Charline Bills M.D.   On: 02/28/2017 09:44    Procedures Dr. Sheliah Hatch (02/29/16) - excisional debridement of 3x6cm full thickness necrotic tissue and drainage of abscess  Hospital Course:  Roy [name] is a 57yo male PMH HTN who was directly admitted to [facility] 1/29 from general surgery outpatient clinic with left thigh abscess.  Previously seen in urgent care where he was given doxycycline. Unable to I&D at bedside therefore patient underwent the procedure listed above in the OR. Intraoperative cultures were taken. Tolerated procedure well and was transferred to the floor.  Internal medicine was consulted for AKI and assistance with blood pressure management. On POD1 the patient was voiding well, tolerating diet, ambulating well, pain well controlled, vital signs stable, incisions c/d/i and felt stable for discharge home.  He will go home with augment and follow up in our office in 1-2 weeks and knows to call with questions or concerns.    I have personally reviewed the patients medication history on the [place] controlled substance database.   Physical Exam: General:  Alert, NAD, pleasant, comfortable Pulm: effort normal Abd:  obese, soft, NT LLE: mid-shaft thigh abscess s/p excisional  debridement about 3x6x2cm with beefy red tissue and no erythema or drainage     Allergies as of 03/01/2017   No Known Allergies     Medication List    STOP taking these medications   doxycycline 100 MG capsule Commonly known as:  VIBRAMYCIN   lisinopril-hydrochlorothiazide 10-12.5 MG tablet Commonly known as:  PRINZIDE,ZESTORETIC   predniSONE 10 MG tablet Commonly known as:  DELTASONE     TAKE these medications   amLODipine 10 MG tablet Commonly known as:  NORVASC Take 1 tablet (10 mg total) by mouth daily. Start taking on:  03/02/2017   amoxicillin-clavulanate 875-125 MG tablet Commonly known as:  AUGMENTIN Take 1 tablet by mouth 2 (two) times daily for 5 days.   diclofenac 75 MG EC tablet Commonly known as:  VOLTAREN Take 1 tablet (75 mg total) by mouth 2 (two) times daily.   nebivolol 2.5 MG tablet Commonly known as:  BYSTOLIC Take 1 tablet (2.5 mg total) by mouth daily.   oxyCODONE 5 MG immediate release tablet Commonly known as:  Oxy IR/ROXICODONE Take 1 tablet (5 mg total) by mouth every 6 (six) hours as needed for severe pain.        Follow-up Information    [facility], [place]. Call in 1 week(s).   Specialty:  General Surgery Why:  We are working on your appointment, please call to confirm. Please arrive 30 minutes prior to your appointment to check in and fill out paperwork. Bring photo ID and insurance information. Contact information: [address] [place] [address] [phone]       Renaye Rakers, MD. Call in 1  week(s).   Specialty:  Family Medicine Why:  Call to arranage follow up regarding elevated blood pressure and medications Contact information: [address] [place] [address] [phone]           Signed: Franne FortsBrooke A Mackie Goon, [facility] 03/01/2017, 10:02 AM Pager: [phone] Consults: [phone] Mon-Fri 7:00 am-4:30 pm Sat-Sun 7:00 am-11:30 am

## 2017-03-01 NOTE — Discharge Instructions (Signed)
WOUND CARE: °- dressing to be changed twice daily °- supplies: sterile saline, kerlix, scissors, ABD pads, tape  °- remove dressing and all packing carefully, moistening with sterile saline as needed to avoid packing/internal dressing sticking to the wound. °- clean edges of skin around the wound with water/gauze, making sure there is no tape debris or leakage left on skin that could cause skin irritation or breakdown. °- dampen and clean kerlix with sterile saline and pack wound from wound base to skin level, making sure to take note of any possible areas of wound tracking, tunneling and packing appropriately. Wound can be packed loosely. Trim kerlix to size if a whole kerlix is not required. °- cover wound with a dry ABD pad and secure with tape.  °- write the date/time on the dry dressing/tape to better track when the last dressing change occurred. °- change dressing as needed if leakage occurs, wound gets contaminated, or patient requests to shower. °- patient may shower daily with wound open and following the shower the wound should be dried and a clean dressing placed.  ° °

## 2017-03-05 LAB — AEROBIC/ANAEROBIC CULTURE W GRAM STAIN (SURGICAL/DEEP WOUND)

## 2017-03-05 LAB — AEROBIC/ANAEROBIC CULTURE (SURGICAL/DEEP WOUND): CULTURE: NO GROWTH

## 2017-03-23 DIAGNOSIS — E6609 Other obesity due to excess calories: Secondary | ICD-10-CM | POA: Diagnosis not present

## 2017-03-23 DIAGNOSIS — R7309 Other abnormal glucose: Secondary | ICD-10-CM | POA: Diagnosis not present

## 2017-03-23 DIAGNOSIS — I119 Hypertensive heart disease without heart failure: Secondary | ICD-10-CM | POA: Diagnosis not present

## 2017-03-23 DIAGNOSIS — N189 Chronic kidney disease, unspecified: Secondary | ICD-10-CM | POA: Diagnosis not present

## 2017-04-09 DIAGNOSIS — I119 Hypertensive heart disease without heart failure: Secondary | ICD-10-CM | POA: Diagnosis not present

## 2017-04-09 DIAGNOSIS — R7309 Other abnormal glucose: Secondary | ICD-10-CM | POA: Diagnosis not present

## 2017-04-09 DIAGNOSIS — I10 Essential (primary) hypertension: Secondary | ICD-10-CM | POA: Diagnosis not present

## 2017-05-10 DIAGNOSIS — Z9889 Other specified postprocedural states: Secondary | ICD-10-CM | POA: Diagnosis not present

## 2017-05-10 DIAGNOSIS — L02416 Cutaneous abscess of left lower limb: Secondary | ICD-10-CM | POA: Diagnosis not present

## 2017-05-17 DIAGNOSIS — L02416 Cutaneous abscess of left lower limb: Secondary | ICD-10-CM | POA: Diagnosis not present

## 2017-06-08 DIAGNOSIS — I10 Essential (primary) hypertension: Secondary | ICD-10-CM | POA: Diagnosis not present

## 2017-06-27 DIAGNOSIS — L02416 Cutaneous abscess of left lower limb: Secondary | ICD-10-CM | POA: Diagnosis not present

## 2017-07-20 DIAGNOSIS — R7309 Other abnormal glucose: Secondary | ICD-10-CM | POA: Diagnosis not present

## 2017-07-20 DIAGNOSIS — I119 Hypertensive heart disease without heart failure: Secondary | ICD-10-CM | POA: Diagnosis not present

## 2017-08-17 DIAGNOSIS — Z9889 Other specified postprocedural states: Secondary | ICD-10-CM | POA: Diagnosis not present

## 2017-08-30 DIAGNOSIS — E782 Mixed hyperlipidemia: Secondary | ICD-10-CM | POA: Diagnosis not present

## 2017-08-30 DIAGNOSIS — I119 Hypertensive heart disease without heart failure: Secondary | ICD-10-CM | POA: Diagnosis not present

## 2017-08-30 DIAGNOSIS — I10 Essential (primary) hypertension: Secondary | ICD-10-CM | POA: Diagnosis not present

## 2017-08-30 DIAGNOSIS — M1000 Idiopathic gout, unspecified site: Secondary | ICD-10-CM | POA: Diagnosis not present

## 2017-09-11 ENCOUNTER — Ambulatory Visit (HOSPITAL_COMMUNITY)
Admission: RE | Admit: 2017-09-11 | Discharge: 2017-09-11 | Disposition: A | Discharge status: Home / Self Care | Payer: BLUE CROSS/BLUE SHIELD | Source: Ambulatory Visit | Attending: Physician Assistant | Admitting: Physician Assistant

## 2017-09-11 ENCOUNTER — Encounter (HOSPITAL_BASED_OUTPATIENT_CLINIC_OR_DEPARTMENT_OTHER): Payer: BLUE CROSS/BLUE SHIELD | Attending: Internal Medicine

## 2017-09-11 ENCOUNTER — Other Ambulatory Visit: Payer: Self-pay | Admitting: Physician Assistant

## 2017-09-11 DIAGNOSIS — S71102D Unspecified open wound, left thigh, subsequent encounter: Secondary | ICD-10-CM

## 2017-09-11 DIAGNOSIS — L97122 Non-pressure chronic ulcer of left thigh with fat layer exposed: Secondary | ICD-10-CM | POA: Diagnosis not present

## 2017-09-11 DIAGNOSIS — L98492 Non-pressure chronic ulcer of skin of other sites with fat layer exposed: Secondary | ICD-10-CM | POA: Insufficient documentation

## 2017-09-11 DIAGNOSIS — I10 Essential (primary) hypertension: Secondary | ICD-10-CM | POA: Diagnosis not present

## 2017-09-11 DIAGNOSIS — S71102A Unspecified open wound, left thigh, initial encounter: Secondary | ICD-10-CM | POA: Diagnosis not present

## 2017-09-11 DIAGNOSIS — X58XXXD Exposure to other specified factors, subsequent encounter: Secondary | ICD-10-CM | POA: Diagnosis not present

## 2017-09-13 ENCOUNTER — Other Ambulatory Visit: Payer: Self-pay | Admitting: Physician Assistant

## 2017-09-13 DIAGNOSIS — S81802A Unspecified open wound, left lower leg, initial encounter: Secondary | ICD-10-CM

## 2017-09-13 DIAGNOSIS — M79652 Pain in left thigh: Secondary | ICD-10-CM

## 2017-09-19 ENCOUNTER — Ambulatory Visit (HOSPITAL_COMMUNITY)
Admission: RE | Admit: 2017-09-19 | Discharge: 2017-09-19 | Disposition: A | Discharge status: Home / Self Care | Payer: BLUE CROSS/BLUE SHIELD | Source: Ambulatory Visit | Attending: Physician Assistant | Admitting: Physician Assistant

## 2017-09-19 DIAGNOSIS — M869 Osteomyelitis, unspecified: Secondary | ICD-10-CM | POA: Insufficient documentation

## 2017-09-19 DIAGNOSIS — L98492 Non-pressure chronic ulcer of skin of other sites with fat layer exposed: Secondary | ICD-10-CM | POA: Diagnosis not present

## 2017-09-19 DIAGNOSIS — Y838 Other surgical procedures as the cause of abnormal reaction of the patient, or of later complication, without mention of misadventure at the time of the procedure: Secondary | ICD-10-CM | POA: Insufficient documentation

## 2017-09-19 DIAGNOSIS — S81802A Unspecified open wound, left lower leg, initial encounter: Secondary | ICD-10-CM

## 2017-09-19 DIAGNOSIS — L02416 Cutaneous abscess of left lower limb: Secondary | ICD-10-CM | POA: Insufficient documentation

## 2017-09-19 DIAGNOSIS — T84091A Other mechanical complication of internal left hip prosthesis, initial encounter: Secondary | ICD-10-CM | POA: Diagnosis not present

## 2017-09-19 DIAGNOSIS — Z96642 Presence of left artificial hip joint: Secondary | ICD-10-CM | POA: Insufficient documentation

## 2017-09-19 DIAGNOSIS — L97122 Non-pressure chronic ulcer of left thigh with fat layer exposed: Secondary | ICD-10-CM | POA: Diagnosis not present

## 2017-09-19 DIAGNOSIS — M79652 Pain in left thigh: Secondary | ICD-10-CM | POA: Insufficient documentation

## 2017-09-19 DIAGNOSIS — L02415 Cutaneous abscess of right lower limb: Secondary | ICD-10-CM | POA: Diagnosis not present

## 2017-09-19 DIAGNOSIS — I10 Essential (primary) hypertension: Secondary | ICD-10-CM | POA: Diagnosis not present

## 2017-09-19 DIAGNOSIS — M86652 Other chronic osteomyelitis, left thigh: Secondary | ICD-10-CM | POA: Diagnosis not present

## 2017-09-19 DIAGNOSIS — S71102A Unspecified open wound, left thigh, initial encounter: Secondary | ICD-10-CM | POA: Diagnosis not present

## 2017-09-19 DIAGNOSIS — L988 Other specified disorders of the skin and subcutaneous tissue: Secondary | ICD-10-CM | POA: Diagnosis not present

## 2017-09-19 DIAGNOSIS — B9689 Other specified bacterial agents as the cause of diseases classified elsewhere: Secondary | ICD-10-CM | POA: Diagnosis not present

## 2017-09-19 LAB — POCT I-STAT CREATININE: Creatinine, Ser: 1.7 mg/dL — ABNORMAL HIGH (ref 0.61–1.24)

## 2017-09-19 MED ORDER — GADOBENATE DIMEGLUMINE 529 MG/ML IV SOLN
20.0000 mL | Freq: Once | INTRAVENOUS | Status: AC | PRN
  Administered 2017-09-19: 20 mL via INTRAVENOUS

## 2017-09-26 ENCOUNTER — Other Ambulatory Visit (HOSPITAL_COMMUNITY)
Admission: RE | Admit: 2017-09-26 | Discharge: 2017-09-26 | Disposition: A | Discharge status: Home / Self Care | Payer: BLUE CROSS/BLUE SHIELD | Source: Other Acute Inpatient Hospital | Attending: Physician Assistant | Admitting: Physician Assistant

## 2017-09-26 DIAGNOSIS — L089 Local infection of the skin and subcutaneous tissue, unspecified: Secondary | ICD-10-CM | POA: Diagnosis not present

## 2017-09-26 DIAGNOSIS — L97129 Non-pressure chronic ulcer of left thigh with unspecified severity: Secondary | ICD-10-CM | POA: Diagnosis not present

## 2017-09-26 DIAGNOSIS — L97122 Non-pressure chronic ulcer of left thigh with fat layer exposed: Secondary | ICD-10-CM | POA: Diagnosis not present

## 2017-09-26 DIAGNOSIS — I10 Essential (primary) hypertension: Secondary | ICD-10-CM | POA: Diagnosis not present

## 2017-09-26 DIAGNOSIS — L98492 Non-pressure chronic ulcer of skin of other sites with fat layer exposed: Secondary | ICD-10-CM | POA: Diagnosis not present

## 2017-09-29 LAB — AEROBIC CULTURE  (SUPERFICIAL SPECIMEN): GRAM STAIN: NONE SEEN

## 2017-09-29 LAB — AEROBIC CULTURE W GRAM STAIN (SUPERFICIAL SPECIMEN): Culture: NO GROWTH

## 2017-10-03 ENCOUNTER — Encounter (HOSPITAL_BASED_OUTPATIENT_CLINIC_OR_DEPARTMENT_OTHER): Payer: BLUE CROSS/BLUE SHIELD | Attending: Physician Assistant

## 2017-10-03 DIAGNOSIS — M869 Osteomyelitis, unspecified: Secondary | ICD-10-CM | POA: Diagnosis not present

## 2017-10-03 DIAGNOSIS — L97122 Non-pressure chronic ulcer of left thigh with fat layer exposed: Secondary | ICD-10-CM | POA: Insufficient documentation

## 2017-10-03 DIAGNOSIS — I10 Essential (primary) hypertension: Secondary | ICD-10-CM | POA: Diagnosis not present

## 2017-10-03 DIAGNOSIS — L02416 Cutaneous abscess of left lower limb: Secondary | ICD-10-CM | POA: Diagnosis not present

## 2017-10-09 DIAGNOSIS — M76892 Other specified enthesopathies of left lower limb, excluding foot: Secondary | ICD-10-CM | POA: Diagnosis not present

## 2017-10-09 DIAGNOSIS — Y792 Prosthetic and other implants, materials and accessory orthopedic devices associated with adverse incidents: Secondary | ICD-10-CM | POA: Diagnosis not present

## 2017-10-09 DIAGNOSIS — T8484XA Pain due to internal orthopedic prosthetic devices, implants and grafts, initial encounter: Secondary | ICD-10-CM | POA: Diagnosis not present

## 2017-10-09 DIAGNOSIS — T8452XD Infection and inflammatory reaction due to internal left hip prosthesis, subsequent encounter: Secondary | ICD-10-CM | POA: Diagnosis not present

## 2017-10-09 DIAGNOSIS — M25551 Pain in right hip: Secondary | ICD-10-CM | POA: Diagnosis not present

## 2017-10-09 DIAGNOSIS — Z96649 Presence of unspecified artificial hip joint: Secondary | ICD-10-CM | POA: Diagnosis not present

## 2017-10-09 DIAGNOSIS — Z96642 Presence of left artificial hip joint: Secondary | ICD-10-CM | POA: Diagnosis not present

## 2017-10-09 DIAGNOSIS — M25552 Pain in left hip: Secondary | ICD-10-CM | POA: Diagnosis not present

## 2017-10-11 DIAGNOSIS — Z96642 Presence of left artificial hip joint: Secondary | ICD-10-CM | POA: Diagnosis not present

## 2017-10-11 DIAGNOSIS — M76892 Other specified enthesopathies of left lower limb, excluding foot: Secondary | ICD-10-CM | POA: Diagnosis not present

## 2017-10-11 DIAGNOSIS — L988 Other specified disorders of the skin and subcutaneous tissue: Secondary | ICD-10-CM | POA: Diagnosis not present

## 2017-11-01 DIAGNOSIS — Z6841 Body Mass Index (BMI) 40.0 and over, adult: Secondary | ICD-10-CM | POA: Diagnosis not present

## 2017-11-01 DIAGNOSIS — I119 Hypertensive heart disease without heart failure: Secondary | ICD-10-CM | POA: Diagnosis not present

## 2017-11-01 DIAGNOSIS — E6609 Other obesity due to excess calories: Secondary | ICD-10-CM | POA: Diagnosis not present

## 2017-12-05 DIAGNOSIS — N189 Chronic kidney disease, unspecified: Secondary | ICD-10-CM | POA: Diagnosis not present

## 2017-12-05 DIAGNOSIS — I10 Essential (primary) hypertension: Secondary | ICD-10-CM | POA: Diagnosis not present

## 2017-12-05 DIAGNOSIS — I119 Hypertensive heart disease without heart failure: Secondary | ICD-10-CM | POA: Diagnosis not present

## 2018-01-04 ENCOUNTER — Ambulatory Visit
Admission: RE | Admit: 2018-01-04 | Discharge: 2018-01-04 | Disposition: A | Discharge status: Home / Self Care | Payer: BLUE CROSS/BLUE SHIELD | Source: Ambulatory Visit | Attending: Family Medicine | Admitting: Family Medicine

## 2018-01-04 ENCOUNTER — Other Ambulatory Visit: Payer: Self-pay | Admitting: Family Medicine

## 2018-01-04 DIAGNOSIS — I10 Essential (primary) hypertension: Secondary | ICD-10-CM | POA: Diagnosis not present

## 2018-01-04 DIAGNOSIS — Z471 Aftercare following joint replacement surgery: Secondary | ICD-10-CM | POA: Diagnosis not present

## 2018-01-04 DIAGNOSIS — Z96642 Presence of left artificial hip joint: Secondary | ICD-10-CM | POA: Diagnosis not present

## 2018-01-04 DIAGNOSIS — L02818 Cutaneous abscess of other sites: Secondary | ICD-10-CM

## 2018-01-04 DIAGNOSIS — E6609 Other obesity due to excess calories: Secondary | ICD-10-CM | POA: Diagnosis not present

## 2018-01-18 DIAGNOSIS — L02416 Cutaneous abscess of left lower limb: Secondary | ICD-10-CM | POA: Diagnosis not present

## 2018-02-04 DIAGNOSIS — M2518 Fistula, other specified site: Secondary | ICD-10-CM | POA: Diagnosis not present

## 2018-02-04 DIAGNOSIS — Z6841 Body Mass Index (BMI) 40.0 and over, adult: Secondary | ICD-10-CM | POA: Diagnosis not present

## 2018-02-04 DIAGNOSIS — E6609 Other obesity due to excess calories: Secondary | ICD-10-CM | POA: Diagnosis not present

## 2018-02-04 DIAGNOSIS — I119 Hypertensive heart disease without heart failure: Secondary | ICD-10-CM | POA: Diagnosis not present

## 2018-02-27 DIAGNOSIS — M869 Osteomyelitis, unspecified: Secondary | ICD-10-CM | POA: Diagnosis not present

## 2018-04-05 DIAGNOSIS — I10 Essential (primary) hypertension: Secondary | ICD-10-CM | POA: Diagnosis not present

## 2018-04-05 DIAGNOSIS — L02416 Cutaneous abscess of left lower limb: Secondary | ICD-10-CM | POA: Diagnosis not present

## 2018-04-09 DIAGNOSIS — Z471 Aftercare following joint replacement surgery: Secondary | ICD-10-CM | POA: Diagnosis not present

## 2018-04-09 DIAGNOSIS — T8452XD Infection and inflammatory reaction due to internal left hip prosthesis, subsequent encounter: Secondary | ICD-10-CM | POA: Diagnosis not present

## 2018-04-09 DIAGNOSIS — Z96643 Presence of artificial hip joint, bilateral: Secondary | ICD-10-CM | POA: Diagnosis not present

## 2018-04-09 DIAGNOSIS — Z96642 Presence of left artificial hip joint: Secondary | ICD-10-CM | POA: Diagnosis not present

## 2018-04-09 DIAGNOSIS — Z6841 Body Mass Index (BMI) 40.0 and over, adult: Secondary | ICD-10-CM | POA: Diagnosis not present

## 2018-04-09 DIAGNOSIS — T8484XA Pain due to internal orthopedic prosthetic devices, implants and grafts, initial encounter: Secondary | ICD-10-CM | POA: Diagnosis not present

## 2018-05-07 DIAGNOSIS — Y831 Surgical operation with implant of artificial internal device as the cause of abnormal reaction of the patient, or of later complication, without mention of misadventure at the time of the procedure: Secondary | ICD-10-CM | POA: Diagnosis not present

## 2018-05-07 DIAGNOSIS — Z96642 Presence of left artificial hip joint: Secondary | ICD-10-CM | POA: Diagnosis not present

## 2018-05-07 DIAGNOSIS — Z96649 Presence of unspecified artificial hip joint: Secondary | ICD-10-CM | POA: Diagnosis not present

## 2018-05-07 DIAGNOSIS — T8459XA Infection and inflammatory reaction due to other internal joint prosthesis, initial encounter: Secondary | ICD-10-CM | POA: Diagnosis not present

## 2018-05-07 DIAGNOSIS — I10 Essential (primary) hypertension: Secondary | ICD-10-CM | POA: Diagnosis not present

## 2018-05-07 DIAGNOSIS — E669 Obesity, unspecified: Secondary | ICD-10-CM | POA: Diagnosis not present

## 2018-05-07 DIAGNOSIS — Z6841 Body Mass Index (BMI) 40.0 and over, adult: Secondary | ICD-10-CM | POA: Diagnosis not present

## 2018-05-17 DIAGNOSIS — Y831 Surgical operation with implant of artificial internal device as the cause of abnormal reaction of the patient, or of later complication, without mention of misadventure at the time of the procedure: Secondary | ICD-10-CM | POA: Diagnosis not present

## 2018-05-17 DIAGNOSIS — Z96642 Presence of left artificial hip joint: Secondary | ICD-10-CM | POA: Diagnosis not present

## 2018-05-17 DIAGNOSIS — T8484XA Pain due to internal orthopedic prosthetic devices, implants and grafts, initial encounter: Secondary | ICD-10-CM | POA: Diagnosis not present

## 2018-06-05 DIAGNOSIS — I10 Essential (primary) hypertension: Secondary | ICD-10-CM | POA: Diagnosis not present

## 2018-06-10 DIAGNOSIS — M109 Gout, unspecified: Secondary | ICD-10-CM | POA: Diagnosis not present

## 2018-06-10 DIAGNOSIS — Z96649 Presence of unspecified artificial hip joint: Secondary | ICD-10-CM | POA: Diagnosis not present

## 2018-06-10 DIAGNOSIS — T8450XD Infection and inflammatory reaction due to unspecified internal joint prosthesis, subsequent encounter: Secondary | ICD-10-CM | POA: Diagnosis not present

## 2018-06-10 DIAGNOSIS — K56609 Unspecified intestinal obstruction, unspecified as to partial versus complete obstruction: Secondary | ICD-10-CM | POA: Diagnosis not present

## 2018-06-10 DIAGNOSIS — I129 Hypertensive chronic kidney disease with stage 1 through stage 4 chronic kidney disease, or unspecified chronic kidney disease: Secondary | ICD-10-CM | POA: Diagnosis not present

## 2018-06-10 DIAGNOSIS — Z79899 Other long term (current) drug therapy: Secondary | ICD-10-CM | POA: Diagnosis not present

## 2018-06-10 DIAGNOSIS — D62 Acute posthemorrhagic anemia: Secondary | ICD-10-CM | POA: Diagnosis not present

## 2018-06-10 DIAGNOSIS — K648 Other hemorrhoids: Secondary | ICD-10-CM | POA: Diagnosis not present

## 2018-06-10 DIAGNOSIS — K5730 Diverticulosis of large intestine without perforation or abscess without bleeding: Secondary | ICD-10-CM | POA: Diagnosis not present

## 2018-06-10 DIAGNOSIS — K560 Paralytic ileus: Secondary | ICD-10-CM | POA: Diagnosis not present

## 2018-06-10 DIAGNOSIS — T8450XA Infection and inflammatory reaction due to unspecified internal joint prosthesis, initial encounter: Secondary | ICD-10-CM | POA: Diagnosis not present

## 2018-06-10 DIAGNOSIS — K9189 Other postprocedural complications and disorders of digestive system: Secondary | ICD-10-CM | POA: Diagnosis not present

## 2018-06-10 DIAGNOSIS — K5732 Diverticulitis of large intestine without perforation or abscess without bleeding: Secondary | ICD-10-CM | POA: Diagnosis not present

## 2018-06-10 DIAGNOSIS — N182 Chronic kidney disease, stage 2 (mild): Secondary | ICD-10-CM | POA: Diagnosis not present

## 2018-06-10 DIAGNOSIS — G8918 Other acute postprocedural pain: Secondary | ICD-10-CM | POA: Diagnosis not present

## 2018-06-10 DIAGNOSIS — M25571 Pain in right ankle and joints of right foot: Secondary | ICD-10-CM | POA: Diagnosis not present

## 2018-06-10 DIAGNOSIS — Z452 Encounter for adjustment and management of vascular access device: Secondary | ICD-10-CM | POA: Diagnosis not present

## 2018-06-10 DIAGNOSIS — Z96643 Presence of artificial hip joint, bilateral: Secondary | ICD-10-CM | POA: Diagnosis not present

## 2018-06-10 DIAGNOSIS — K6389 Other specified diseases of intestine: Secondary | ICD-10-CM | POA: Diagnosis not present

## 2018-06-10 DIAGNOSIS — K633 Ulcer of intestine: Secondary | ICD-10-CM | POA: Diagnosis not present

## 2018-06-10 DIAGNOSIS — Z96642 Presence of left artificial hip joint: Secondary | ICD-10-CM | POA: Diagnosis not present

## 2018-06-10 DIAGNOSIS — T8459XA Infection and inflammatory reaction due to other internal joint prosthesis, initial encounter: Secondary | ICD-10-CM | POA: Diagnosis not present

## 2018-06-10 DIAGNOSIS — K5903 Drug induced constipation: Secondary | ICD-10-CM | POA: Diagnosis not present

## 2018-06-10 DIAGNOSIS — Z471 Aftercare following joint replacement surgery: Secondary | ICD-10-CM | POA: Diagnosis not present

## 2018-06-10 DIAGNOSIS — T8459XD Infection and inflammatory reaction due to other internal joint prosthesis, subsequent encounter: Secondary | ICD-10-CM | POA: Diagnosis not present

## 2018-06-10 DIAGNOSIS — R1032 Left lower quadrant pain: Secondary | ICD-10-CM | POA: Diagnosis not present

## 2018-06-10 DIAGNOSIS — I96 Gangrene, not elsewhere classified: Secondary | ICD-10-CM | POA: Diagnosis not present

## 2018-06-10 DIAGNOSIS — T8452XA Infection and inflammatory reaction due to internal left hip prosthesis, initial encounter: Secondary | ICD-10-CM | POA: Diagnosis not present

## 2018-06-10 DIAGNOSIS — I10 Essential (primary) hypertension: Secondary | ICD-10-CM | POA: Diagnosis not present

## 2018-06-10 DIAGNOSIS — Z6841 Body Mass Index (BMI) 40.0 and over, adult: Secondary | ICD-10-CM | POA: Diagnosis not present

## 2018-06-10 DIAGNOSIS — L02416 Cutaneous abscess of left lower limb: Secondary | ICD-10-CM | POA: Diagnosis not present

## 2018-06-10 DIAGNOSIS — N179 Acute kidney failure, unspecified: Secondary | ICD-10-CM | POA: Diagnosis not present

## 2018-06-10 DIAGNOSIS — D72829 Elevated white blood cell count, unspecified: Secondary | ICD-10-CM | POA: Diagnosis not present

## 2018-06-10 DIAGNOSIS — K567 Ileus, unspecified: Secondary | ICD-10-CM | POA: Diagnosis not present

## 2018-06-10 DIAGNOSIS — R112 Nausea with vomiting, unspecified: Secondary | ICD-10-CM | POA: Diagnosis not present

## 2018-06-10 DIAGNOSIS — R933 Abnormal findings on diagnostic imaging of other parts of digestive tract: Secondary | ICD-10-CM | POA: Diagnosis not present

## 2018-06-10 DIAGNOSIS — R140 Abdominal distension (gaseous): Secondary | ICD-10-CM | POA: Diagnosis not present

## 2018-06-10 DIAGNOSIS — M7989 Other specified soft tissue disorders: Secondary | ICD-10-CM | POA: Diagnosis not present

## 2018-06-10 DIAGNOSIS — K598 Other specified functional intestinal disorders: Secondary | ICD-10-CM | POA: Diagnosis not present

## 2018-06-10 DIAGNOSIS — Z96641 Presence of right artificial hip joint: Secondary | ICD-10-CM | POA: Diagnosis not present

## 2018-06-10 DIAGNOSIS — Z7982 Long term (current) use of aspirin: Secondary | ICD-10-CM | POA: Diagnosis not present

## 2018-06-12 DIAGNOSIS — T8459XA Infection and inflammatory reaction due to other internal joint prosthesis, initial encounter: Secondary | ICD-10-CM | POA: Diagnosis not present

## 2018-06-12 DIAGNOSIS — Z96643 Presence of artificial hip joint, bilateral: Secondary | ICD-10-CM | POA: Diagnosis not present

## 2018-06-12 DIAGNOSIS — T8450XA Infection and inflammatory reaction due to unspecified internal joint prosthesis, initial encounter: Secondary | ICD-10-CM | POA: Diagnosis not present

## 2018-06-12 DIAGNOSIS — T8452XA Infection and inflammatory reaction due to internal left hip prosthesis, initial encounter: Secondary | ICD-10-CM | POA: Diagnosis not present

## 2018-06-12 DIAGNOSIS — G8918 Other acute postprocedural pain: Secondary | ICD-10-CM | POA: Diagnosis not present

## 2018-06-13 DIAGNOSIS — D62 Acute posthemorrhagic anemia: Secondary | ICD-10-CM | POA: Insufficient documentation

## 2018-06-13 DIAGNOSIS — N189 Chronic kidney disease, unspecified: Secondary | ICD-10-CM | POA: Insufficient documentation

## 2018-06-13 DIAGNOSIS — Z96649 Presence of unspecified artificial hip joint: Secondary | ICD-10-CM | POA: Diagnosis not present

## 2018-06-13 DIAGNOSIS — I10 Essential (primary) hypertension: Secondary | ICD-10-CM | POA: Insufficient documentation

## 2018-06-13 DIAGNOSIS — G8918 Other acute postprocedural pain: Secondary | ICD-10-CM | POA: Insufficient documentation

## 2018-06-13 DIAGNOSIS — T8459XA Infection and inflammatory reaction due to other internal joint prosthesis, initial encounter: Secondary | ICD-10-CM | POA: Diagnosis not present

## 2018-06-14 DIAGNOSIS — T8459XA Infection and inflammatory reaction due to other internal joint prosthesis, initial encounter: Secondary | ICD-10-CM | POA: Diagnosis not present

## 2018-06-14 DIAGNOSIS — Z96649 Presence of unspecified artificial hip joint: Secondary | ICD-10-CM | POA: Diagnosis not present

## 2018-06-16 DIAGNOSIS — Z471 Aftercare following joint replacement surgery: Secondary | ICD-10-CM | POA: Diagnosis not present

## 2018-06-16 DIAGNOSIS — Z96649 Presence of unspecified artificial hip joint: Secondary | ICD-10-CM | POA: Diagnosis not present

## 2018-06-16 DIAGNOSIS — T8459XD Infection and inflammatory reaction due to other internal joint prosthesis, subsequent encounter: Secondary | ICD-10-CM | POA: Diagnosis not present

## 2018-06-16 DIAGNOSIS — R140 Abdominal distension (gaseous): Secondary | ICD-10-CM | POA: Diagnosis not present

## 2018-06-17 DIAGNOSIS — R933 Abnormal findings on diagnostic imaging of other parts of digestive tract: Secondary | ICD-10-CM | POA: Diagnosis not present

## 2018-06-17 DIAGNOSIS — T8459XA Infection and inflammatory reaction due to other internal joint prosthesis, initial encounter: Secondary | ICD-10-CM | POA: Diagnosis not present

## 2018-06-17 DIAGNOSIS — K9189 Other postprocedural complications and disorders of digestive system: Secondary | ICD-10-CM | POA: Diagnosis not present

## 2018-06-17 DIAGNOSIS — R140 Abdominal distension (gaseous): Secondary | ICD-10-CM | POA: Diagnosis not present

## 2018-06-17 DIAGNOSIS — K560 Paralytic ileus: Secondary | ICD-10-CM | POA: Diagnosis not present

## 2018-06-17 DIAGNOSIS — D72829 Elevated white blood cell count, unspecified: Secondary | ICD-10-CM | POA: Diagnosis not present

## 2018-06-17 DIAGNOSIS — K56609 Unspecified intestinal obstruction, unspecified as to partial versus complete obstruction: Secondary | ICD-10-CM | POA: Diagnosis not present

## 2018-06-17 DIAGNOSIS — T8459XD Infection and inflammatory reaction due to other internal joint prosthesis, subsequent encounter: Secondary | ICD-10-CM | POA: Diagnosis not present

## 2018-06-17 DIAGNOSIS — K567 Ileus, unspecified: Secondary | ICD-10-CM | POA: Diagnosis not present

## 2018-06-17 DIAGNOSIS — Z96649 Presence of unspecified artificial hip joint: Secondary | ICD-10-CM | POA: Diagnosis not present

## 2018-06-18 DIAGNOSIS — R140 Abdominal distension (gaseous): Secondary | ICD-10-CM | POA: Diagnosis not present

## 2018-06-18 DIAGNOSIS — Z96649 Presence of unspecified artificial hip joint: Secondary | ICD-10-CM | POA: Diagnosis not present

## 2018-06-18 DIAGNOSIS — K9189 Other postprocedural complications and disorders of digestive system: Secondary | ICD-10-CM | POA: Diagnosis not present

## 2018-06-18 DIAGNOSIS — R933 Abnormal findings on diagnostic imaging of other parts of digestive tract: Secondary | ICD-10-CM | POA: Diagnosis not present

## 2018-06-18 DIAGNOSIS — K567 Ileus, unspecified: Secondary | ICD-10-CM | POA: Diagnosis not present

## 2018-06-18 DIAGNOSIS — T8459XD Infection and inflammatory reaction due to other internal joint prosthesis, subsequent encounter: Secondary | ICD-10-CM | POA: Diagnosis not present

## 2018-06-19 DIAGNOSIS — R140 Abdominal distension (gaseous): Secondary | ICD-10-CM | POA: Diagnosis not present

## 2018-06-19 DIAGNOSIS — K9189 Other postprocedural complications and disorders of digestive system: Secondary | ICD-10-CM | POA: Diagnosis not present

## 2018-06-19 DIAGNOSIS — K567 Ileus, unspecified: Secondary | ICD-10-CM | POA: Diagnosis not present

## 2018-06-19 DIAGNOSIS — R933 Abnormal findings on diagnostic imaging of other parts of digestive tract: Secondary | ICD-10-CM | POA: Diagnosis not present

## 2018-06-20 DIAGNOSIS — R1032 Left lower quadrant pain: Secondary | ICD-10-CM | POA: Diagnosis not present

## 2018-06-20 DIAGNOSIS — K6389 Other specified diseases of intestine: Secondary | ICD-10-CM | POA: Diagnosis not present

## 2018-06-20 DIAGNOSIS — K567 Ileus, unspecified: Secondary | ICD-10-CM | POA: Insufficient documentation

## 2018-06-20 DIAGNOSIS — K648 Other hemorrhoids: Secondary | ICD-10-CM | POA: Diagnosis not present

## 2018-06-20 DIAGNOSIS — K633 Ulcer of intestine: Secondary | ICD-10-CM | POA: Diagnosis not present

## 2018-06-20 DIAGNOSIS — R140 Abdominal distension (gaseous): Secondary | ICD-10-CM | POA: Diagnosis not present

## 2018-06-20 DIAGNOSIS — R112 Nausea with vomiting, unspecified: Secondary | ICD-10-CM | POA: Diagnosis not present

## 2018-06-20 DIAGNOSIS — K5730 Diverticulosis of large intestine without perforation or abscess without bleeding: Secondary | ICD-10-CM | POA: Diagnosis not present

## 2018-06-20 DIAGNOSIS — K598 Other specified functional intestinal disorders: Secondary | ICD-10-CM | POA: Diagnosis not present

## 2018-06-21 DIAGNOSIS — Z96649 Presence of unspecified artificial hip joint: Secondary | ICD-10-CM | POA: Diagnosis not present

## 2018-06-21 DIAGNOSIS — K567 Ileus, unspecified: Secondary | ICD-10-CM | POA: Diagnosis not present

## 2018-06-21 DIAGNOSIS — T8459XD Infection and inflammatory reaction due to other internal joint prosthesis, subsequent encounter: Secondary | ICD-10-CM | POA: Diagnosis not present

## 2018-06-21 DIAGNOSIS — R933 Abnormal findings on diagnostic imaging of other parts of digestive tract: Secondary | ICD-10-CM | POA: Diagnosis not present

## 2018-06-21 DIAGNOSIS — R140 Abdominal distension (gaseous): Secondary | ICD-10-CM | POA: Diagnosis not present

## 2018-06-21 DIAGNOSIS — K9189 Other postprocedural complications and disorders of digestive system: Secondary | ICD-10-CM | POA: Diagnosis not present

## 2018-06-22 DIAGNOSIS — T8450XA Infection and inflammatory reaction due to unspecified internal joint prosthesis, initial encounter: Secondary | ICD-10-CM | POA: Diagnosis not present

## 2018-06-22 DIAGNOSIS — Z452 Encounter for adjustment and management of vascular access device: Secondary | ICD-10-CM | POA: Diagnosis not present

## 2018-06-22 DIAGNOSIS — T8459XA Infection and inflammatory reaction due to other internal joint prosthesis, initial encounter: Secondary | ICD-10-CM | POA: Diagnosis not present

## 2018-06-22 DIAGNOSIS — Z96649 Presence of unspecified artificial hip joint: Secondary | ICD-10-CM | POA: Diagnosis not present

## 2018-06-23 DIAGNOSIS — R140 Abdominal distension (gaseous): Secondary | ICD-10-CM | POA: Diagnosis not present

## 2018-06-23 DIAGNOSIS — K6389 Other specified diseases of intestine: Secondary | ICD-10-CM | POA: Diagnosis not present

## 2018-06-23 DIAGNOSIS — K9189 Other postprocedural complications and disorders of digestive system: Secondary | ICD-10-CM | POA: Diagnosis not present

## 2018-06-23 DIAGNOSIS — I10 Essential (primary) hypertension: Secondary | ICD-10-CM | POA: Diagnosis not present

## 2018-06-23 DIAGNOSIS — T8459XA Infection and inflammatory reaction due to other internal joint prosthesis, initial encounter: Secondary | ICD-10-CM | POA: Diagnosis not present

## 2018-06-23 DIAGNOSIS — D62 Acute posthemorrhagic anemia: Secondary | ICD-10-CM | POA: Diagnosis not present

## 2018-06-24 DIAGNOSIS — T8450XD Infection and inflammatory reaction due to unspecified internal joint prosthesis, subsequent encounter: Secondary | ICD-10-CM | POA: Diagnosis not present

## 2018-06-24 DIAGNOSIS — D62 Acute posthemorrhagic anemia: Secondary | ICD-10-CM | POA: Diagnosis not present

## 2018-06-24 DIAGNOSIS — T8459XA Infection and inflammatory reaction due to other internal joint prosthesis, initial encounter: Secondary | ICD-10-CM | POA: Diagnosis not present

## 2018-06-24 DIAGNOSIS — I10 Essential (primary) hypertension: Secondary | ICD-10-CM | POA: Diagnosis not present

## 2018-06-24 DIAGNOSIS — K9189 Other postprocedural complications and disorders of digestive system: Secondary | ICD-10-CM | POA: Diagnosis not present

## 2018-06-25 DIAGNOSIS — K9189 Other postprocedural complications and disorders of digestive system: Secondary | ICD-10-CM | POA: Diagnosis not present

## 2018-06-25 DIAGNOSIS — Z96642 Presence of left artificial hip joint: Secondary | ICD-10-CM | POA: Diagnosis not present

## 2018-06-25 DIAGNOSIS — K567 Ileus, unspecified: Secondary | ICD-10-CM | POA: Diagnosis not present

## 2018-06-25 DIAGNOSIS — I10 Essential (primary) hypertension: Secondary | ICD-10-CM | POA: Diagnosis not present

## 2018-06-25 DIAGNOSIS — T8459XA Infection and inflammatory reaction due to other internal joint prosthesis, initial encounter: Secondary | ICD-10-CM | POA: Diagnosis not present

## 2018-06-25 DIAGNOSIS — D62 Acute posthemorrhagic anemia: Secondary | ICD-10-CM | POA: Diagnosis not present

## 2018-06-25 DIAGNOSIS — Z96649 Presence of unspecified artificial hip joint: Secondary | ICD-10-CM | POA: Diagnosis not present

## 2018-06-25 DIAGNOSIS — R140 Abdominal distension (gaseous): Secondary | ICD-10-CM | POA: Diagnosis not present

## 2018-06-26 DIAGNOSIS — T8450XD Infection and inflammatory reaction due to unspecified internal joint prosthesis, subsequent encounter: Secondary | ICD-10-CM | POA: Diagnosis not present

## 2018-06-27 DIAGNOSIS — Z96649 Presence of unspecified artificial hip joint: Secondary | ICD-10-CM | POA: Diagnosis not present

## 2018-06-27 DIAGNOSIS — T8459XA Infection and inflammatory reaction due to other internal joint prosthesis, initial encounter: Secondary | ICD-10-CM | POA: Diagnosis not present

## 2018-06-28 DIAGNOSIS — M109 Gout, unspecified: Secondary | ICD-10-CM | POA: Insufficient documentation

## 2018-06-28 DIAGNOSIS — Z96649 Presence of unspecified artificial hip joint: Secondary | ICD-10-CM | POA: Diagnosis not present

## 2018-06-28 DIAGNOSIS — M25571 Pain in right ankle and joints of right foot: Secondary | ICD-10-CM | POA: Diagnosis not present

## 2018-06-28 DIAGNOSIS — T8459XA Infection and inflammatory reaction due to other internal joint prosthesis, initial encounter: Secondary | ICD-10-CM | POA: Diagnosis not present

## 2018-06-28 DIAGNOSIS — M7989 Other specified soft tissue disorders: Secondary | ICD-10-CM | POA: Diagnosis not present

## 2018-07-04 DIAGNOSIS — T8450XA Infection and inflammatory reaction due to unspecified internal joint prosthesis, initial encounter: Secondary | ICD-10-CM | POA: Diagnosis not present

## 2018-07-05 DIAGNOSIS — K9189 Other postprocedural complications and disorders of digestive system: Secondary | ICD-10-CM | POA: Diagnosis not present

## 2018-07-05 DIAGNOSIS — Z7982 Long term (current) use of aspirin: Secondary | ICD-10-CM | POA: Diagnosis not present

## 2018-07-05 DIAGNOSIS — N189 Chronic kidney disease, unspecified: Secondary | ICD-10-CM | POA: Diagnosis not present

## 2018-07-05 DIAGNOSIS — K567 Ileus, unspecified: Secondary | ICD-10-CM | POA: Diagnosis not present

## 2018-07-05 DIAGNOSIS — Z96643 Presence of artificial hip joint, bilateral: Secondary | ICD-10-CM | POA: Diagnosis not present

## 2018-07-05 DIAGNOSIS — Z5181 Encounter for therapeutic drug level monitoring: Secondary | ICD-10-CM | POA: Diagnosis not present

## 2018-07-05 DIAGNOSIS — I129 Hypertensive chronic kidney disease with stage 1 through stage 4 chronic kidney disease, or unspecified chronic kidney disease: Secondary | ICD-10-CM | POA: Diagnosis not present

## 2018-07-05 DIAGNOSIS — M1611 Unilateral primary osteoarthritis, right hip: Secondary | ICD-10-CM | POA: Diagnosis not present

## 2018-07-05 DIAGNOSIS — M109 Gout, unspecified: Secondary | ICD-10-CM | POA: Diagnosis not present

## 2018-07-05 DIAGNOSIS — Z452 Encounter for adjustment and management of vascular access device: Secondary | ICD-10-CM | POA: Diagnosis not present

## 2018-07-05 DIAGNOSIS — T8452XA Infection and inflammatory reaction due to internal left hip prosthesis, initial encounter: Secondary | ICD-10-CM | POA: Diagnosis not present

## 2018-07-05 DIAGNOSIS — Z792 Long term (current) use of antibiotics: Secondary | ICD-10-CM | POA: Diagnosis not present

## 2018-07-08 DIAGNOSIS — Z792 Long term (current) use of antibiotics: Secondary | ICD-10-CM | POA: Diagnosis not present

## 2018-07-10 DIAGNOSIS — K9189 Other postprocedural complications and disorders of digestive system: Secondary | ICD-10-CM | POA: Diagnosis not present

## 2018-07-10 DIAGNOSIS — K567 Ileus, unspecified: Secondary | ICD-10-CM | POA: Diagnosis not present

## 2018-07-10 DIAGNOSIS — Z5181 Encounter for therapeutic drug level monitoring: Secondary | ICD-10-CM | POA: Diagnosis not present

## 2018-07-10 DIAGNOSIS — Z792 Long term (current) use of antibiotics: Secondary | ICD-10-CM | POA: Diagnosis not present

## 2018-07-10 DIAGNOSIS — T8452XA Infection and inflammatory reaction due to internal left hip prosthesis, initial encounter: Secondary | ICD-10-CM | POA: Diagnosis not present

## 2018-07-10 DIAGNOSIS — Z7982 Long term (current) use of aspirin: Secondary | ICD-10-CM | POA: Diagnosis not present

## 2018-07-10 DIAGNOSIS — N189 Chronic kidney disease, unspecified: Secondary | ICD-10-CM | POA: Diagnosis not present

## 2018-07-10 DIAGNOSIS — Z452 Encounter for adjustment and management of vascular access device: Secondary | ICD-10-CM | POA: Diagnosis not present

## 2018-07-10 DIAGNOSIS — M109 Gout, unspecified: Secondary | ICD-10-CM | POA: Diagnosis not present

## 2018-07-10 DIAGNOSIS — Z96643 Presence of artificial hip joint, bilateral: Secondary | ICD-10-CM | POA: Diagnosis not present

## 2018-07-10 DIAGNOSIS — I129 Hypertensive chronic kidney disease with stage 1 through stage 4 chronic kidney disease, or unspecified chronic kidney disease: Secondary | ICD-10-CM | POA: Diagnosis not present

## 2018-07-11 DIAGNOSIS — T8450XA Infection and inflammatory reaction due to unspecified internal joint prosthesis, initial encounter: Secondary | ICD-10-CM | POA: Diagnosis not present

## 2018-07-12 DIAGNOSIS — K567 Ileus, unspecified: Secondary | ICD-10-CM | POA: Diagnosis not present

## 2018-07-12 DIAGNOSIS — Z792 Long term (current) use of antibiotics: Secondary | ICD-10-CM | POA: Diagnosis not present

## 2018-07-12 DIAGNOSIS — Z7982 Long term (current) use of aspirin: Secondary | ICD-10-CM | POA: Diagnosis not present

## 2018-07-12 DIAGNOSIS — T8452XA Infection and inflammatory reaction due to internal left hip prosthesis, initial encounter: Secondary | ICD-10-CM | POA: Diagnosis not present

## 2018-07-12 DIAGNOSIS — M109 Gout, unspecified: Secondary | ICD-10-CM | POA: Diagnosis not present

## 2018-07-12 DIAGNOSIS — Z5181 Encounter for therapeutic drug level monitoring: Secondary | ICD-10-CM | POA: Diagnosis not present

## 2018-07-12 DIAGNOSIS — K9189 Other postprocedural complications and disorders of digestive system: Secondary | ICD-10-CM | POA: Diagnosis not present

## 2018-07-12 DIAGNOSIS — N189 Chronic kidney disease, unspecified: Secondary | ICD-10-CM | POA: Diagnosis not present

## 2018-07-12 DIAGNOSIS — Z452 Encounter for adjustment and management of vascular access device: Secondary | ICD-10-CM | POA: Diagnosis not present

## 2018-07-12 DIAGNOSIS — Z96643 Presence of artificial hip joint, bilateral: Secondary | ICD-10-CM | POA: Diagnosis not present

## 2018-07-12 DIAGNOSIS — I129 Hypertensive chronic kidney disease with stage 1 through stage 4 chronic kidney disease, or unspecified chronic kidney disease: Secondary | ICD-10-CM | POA: Diagnosis not present

## 2018-07-15 ENCOUNTER — Emergency Department (HOSPITAL_COMMUNITY)
Admission: EM | Admit: 2018-07-15 | Discharge: 2018-07-16 | Disposition: A | Discharge status: Other Acute Inpatient Hospital | Payer: BC Managed Care – PPO | Attending: Emergency Medicine | Admitting: Emergency Medicine

## 2018-07-15 ENCOUNTER — Emergency Department (HOSPITAL_COMMUNITY): Payer: BC Managed Care – PPO

## 2018-07-15 ENCOUNTER — Other Ambulatory Visit: Payer: Self-pay

## 2018-07-15 ENCOUNTER — Other Ambulatory Visit (HOSPITAL_COMMUNITY)
Admission: RE | Admit: 2018-07-15 | Discharge: 2018-07-15 | Disposition: A | Discharge status: Home / Self Care | Payer: BC Managed Care – PPO | Source: Ambulatory Visit | Attending: Family Medicine | Admitting: Family Medicine

## 2018-07-15 DIAGNOSIS — X58XXXA Exposure to other specified factors, initial encounter: Secondary | ICD-10-CM | POA: Insufficient documentation

## 2018-07-15 DIAGNOSIS — R2242 Localized swelling, mass and lump, left lower limb: Secondary | ICD-10-CM | POA: Insufficient documentation

## 2018-07-15 DIAGNOSIS — Z96643 Presence of artificial hip joint, bilateral: Secondary | ICD-10-CM | POA: Diagnosis not present

## 2018-07-15 DIAGNOSIS — M109 Gout, unspecified: Secondary | ICD-10-CM | POA: Diagnosis not present

## 2018-07-15 DIAGNOSIS — T8484XA Pain due to internal orthopedic prosthetic devices, implants and grafts, initial encounter: Secondary | ICD-10-CM | POA: Diagnosis not present

## 2018-07-15 DIAGNOSIS — Z7982 Long term (current) use of aspirin: Secondary | ICD-10-CM | POA: Diagnosis not present

## 2018-07-15 DIAGNOSIS — Z5181 Encounter for therapeutic drug level monitoring: Secondary | ICD-10-CM | POA: Diagnosis not present

## 2018-07-15 DIAGNOSIS — S72002A Fracture of unspecified part of neck of left femur, initial encounter for closed fracture: Secondary | ICD-10-CM | POA: Diagnosis not present

## 2018-07-15 DIAGNOSIS — Y939 Activity, unspecified: Secondary | ICD-10-CM | POA: Insufficient documentation

## 2018-07-15 DIAGNOSIS — Z96642 Presence of left artificial hip joint: Secondary | ICD-10-CM | POA: Diagnosis not present

## 2018-07-15 DIAGNOSIS — I129 Hypertensive chronic kidney disease with stage 1 through stage 4 chronic kidney disease, or unspecified chronic kidney disease: Secondary | ICD-10-CM | POA: Diagnosis not present

## 2018-07-15 DIAGNOSIS — S79912A Unspecified injury of left hip, initial encounter: Secondary | ICD-10-CM | POA: Diagnosis not present

## 2018-07-15 DIAGNOSIS — R509 Fever, unspecified: Secondary | ICD-10-CM | POA: Diagnosis not present

## 2018-07-15 DIAGNOSIS — Z96641 Presence of right artificial hip joint: Secondary | ICD-10-CM | POA: Diagnosis not present

## 2018-07-15 DIAGNOSIS — M25552 Pain in left hip: Secondary | ICD-10-CM | POA: Diagnosis not present

## 2018-07-15 DIAGNOSIS — Y929 Unspecified place or not applicable: Secondary | ICD-10-CM | POA: Insufficient documentation

## 2018-07-15 DIAGNOSIS — Y33XXXA Other specified events, undetermined intent, initial encounter: Secondary | ICD-10-CM | POA: Diagnosis not present

## 2018-07-15 DIAGNOSIS — N189 Chronic kidney disease, unspecified: Secondary | ICD-10-CM | POA: Diagnosis not present

## 2018-07-15 DIAGNOSIS — Z792 Long term (current) use of antibiotics: Secondary | ICD-10-CM | POA: Diagnosis not present

## 2018-07-15 DIAGNOSIS — S79002A Unspecified physeal fracture of upper end of left femur, initial encounter for closed fracture: Secondary | ICD-10-CM | POA: Insufficient documentation

## 2018-07-15 DIAGNOSIS — I10 Essential (primary) hypertension: Secondary | ICD-10-CM | POA: Diagnosis not present

## 2018-07-15 DIAGNOSIS — K567 Ileus, unspecified: Secondary | ICD-10-CM | POA: Diagnosis not present

## 2018-07-15 DIAGNOSIS — S7292XA Unspecified fracture of left femur, initial encounter for closed fracture: Secondary | ICD-10-CM | POA: Diagnosis not present

## 2018-07-15 DIAGNOSIS — M8589 Other specified disorders of bone density and structure, multiple sites: Secondary | ICD-10-CM | POA: Diagnosis not present

## 2018-07-15 DIAGNOSIS — T8459XA Infection and inflammatory reaction due to other internal joint prosthesis, initial encounter: Secondary | ICD-10-CM | POA: Diagnosis not present

## 2018-07-15 DIAGNOSIS — Z9889 Other specified postprocedural states: Secondary | ICD-10-CM | POA: Insufficient documentation

## 2018-07-15 DIAGNOSIS — Z79899 Other long term (current) drug therapy: Secondary | ICD-10-CM | POA: Diagnosis not present

## 2018-07-15 DIAGNOSIS — Z48817 Encounter for surgical aftercare following surgery on the skin and subcutaneous tissue: Secondary | ICD-10-CM | POA: Diagnosis not present

## 2018-07-15 DIAGNOSIS — M7989 Other specified soft tissue disorders: Secondary | ICD-10-CM | POA: Diagnosis not present

## 2018-07-15 DIAGNOSIS — T8452XA Infection and inflammatory reaction due to internal left hip prosthesis, initial encounter: Secondary | ICD-10-CM | POA: Diagnosis not present

## 2018-07-15 DIAGNOSIS — K9189 Other postprocedural complications and disorders of digestive system: Secondary | ICD-10-CM | POA: Diagnosis not present

## 2018-07-15 DIAGNOSIS — Y999 Unspecified external cause status: Secondary | ICD-10-CM | POA: Diagnosis not present

## 2018-07-15 DIAGNOSIS — R52 Pain, unspecified: Secondary | ICD-10-CM

## 2018-07-15 DIAGNOSIS — D72829 Elevated white blood cell count, unspecified: Secondary | ICD-10-CM | POA: Diagnosis not present

## 2018-07-15 DIAGNOSIS — Z452 Encounter for adjustment and management of vascular access device: Secondary | ICD-10-CM | POA: Diagnosis not present

## 2018-07-15 DIAGNOSIS — M1712 Unilateral primary osteoarthritis, left knee: Secondary | ICD-10-CM | POA: Diagnosis not present

## 2018-07-15 DIAGNOSIS — M25462 Effusion, left knee: Secondary | ICD-10-CM | POA: Diagnosis not present

## 2018-07-15 LAB — COMPREHENSIVE METABOLIC PANEL
ALT: 25 U/L (ref 0–44)
ALT: 25 U/L (ref 0–44)
AST: 19 U/L (ref 15–41)
AST: 21 U/L (ref 15–41)
Albumin: 2.7 g/dL — ABNORMAL LOW (ref 3.5–5.0)
Albumin: 2.8 g/dL — ABNORMAL LOW (ref 3.5–5.0)
Alkaline Phosphatase: 84 U/L (ref 38–126)
Alkaline Phosphatase: 85 U/L (ref 38–126)
Anion gap: 15 (ref 5–15)
Anion gap: 13 (ref 5–15)
BUN: 20 mg/dL (ref 6–20)
BUN: 19 mg/dL (ref 6–20)
CO2: 27 mmol/L (ref 22–32)
CO2: 27 mmol/L (ref 22–32)
Calcium: 9.2 mg/dL (ref 8.9–10.3)
Calcium: 9.3 mg/dL (ref 8.9–10.3)
Chloride: 96 mmol/L — ABNORMAL LOW (ref 98–111)
Chloride: 98 mmol/L (ref 98–111)
Creatinine, Ser: 1.17 mg/dL (ref 0.61–1.24)
Creatinine, Ser: 1.11 mg/dL (ref 0.61–1.24)
GFR calc Af Amer: >60 mL/min (ref >60)
GFR calc Af Amer: >60 mL/min (ref >60)
GFR calc non Af Amer: >60 mL/min (ref >60)
GFR calc non Af Amer: >60 mL/min (ref >60)
Glucose, Bld: 110 mg/dL — ABNORMAL HIGH (ref 70–99)
Glucose, Bld: 113 mg/dL — ABNORMAL HIGH (ref 70–99)
Potassium: 3.4 mmol/L — ABNORMAL LOW (ref 3.5–5.1)
Potassium: 3.6 mmol/L (ref 3.5–5.1)
Sodium: 138 mmol/L (ref 135–145)
Sodium: 138 mmol/L (ref 135–145)
Total Bilirubin: 0.6 mg/dL (ref 0.3–1.2)
Total Bilirubin: 0.6 mg/dL (ref 0.3–1.2)
Total Protein: 7.7 g/dL (ref 6.5–8.1)
Total Protein: 8.1 g/dL (ref 6.5–8.1)

## 2018-07-15 LAB — CBC WITH DIFFERENTIAL/PLATELET
Abs Immature Granulocytes: 0.05 10*3/uL (ref 0.00–0.07)
Abs Immature Granulocytes: 0.05 10*3/uL (ref 0.00–0.07)
Basophils Absolute: 0.1 10*3/uL (ref 0.0–0.1)
Basophils Absolute: 0.1 10*3/uL (ref 0.0–0.1)
Basophils Relative: 1 %
Basophils Relative: 1 %
Eosinophils Absolute: 0.4 10*3/uL (ref 0.0–0.5)
Eosinophils Absolute: 0.4 10*3/uL (ref 0.0–0.5)
Eosinophils Relative: 4 %
Eosinophils Relative: 3 %
HCT: 28.8 % — ABNORMAL LOW (ref 39.0–52.0)
HCT: 30.7 % — ABNORMAL LOW (ref 39.0–52.0)
Hemoglobin: 8.9 g/dL — ABNORMAL LOW (ref 13.0–17.0)
Hemoglobin: 9.5 g/dL — ABNORMAL LOW (ref 13.0–17.0)
Immature Granulocytes: 1 %
Immature Granulocytes: 0 %
Lymphocytes Relative: 8 %
Lymphocytes Relative: 7 %
Lymphs Abs: 0.9 10*3/uL (ref 0.7–4.0)
Lymphs Abs: 0.8 10*3/uL (ref 0.7–4.0)
MCH: 27.5 pg (ref 26.0–34.0)
MCH: 27.1 pg (ref 26.0–34.0)
MCHC: 30.9 g/dL (ref 30.0–36.0)
MCHC: 30.9 g/dL (ref 30.0–36.0)
MCV: 88.9 fL (ref 80.0–100.0)
MCV: 87.7 fL (ref 80.0–100.0)
Monocytes Absolute: 1.7 10*3/uL — ABNORMAL HIGH (ref 0.1–1.0)
Monocytes Absolute: 1.9 10*3/uL — ABNORMAL HIGH (ref 0.1–1.0)
Monocytes Relative: 16 %
Monocytes Relative: 16 %
Neutro Abs: 7.5 10*3/uL (ref 1.7–7.7)
Neutro Abs: 9.1 10*3/uL — ABNORMAL HIGH (ref 1.7–7.7)
Neutrophils Relative %: 70 %
Neutrophils Relative %: 73 %
Platelets: 311 10*3/uL (ref 150–400)
Platelets: 341 10*3/uL (ref 150–400)
RBC: 3.24 MIL/uL — ABNORMAL LOW (ref 4.22–5.81)
RBC: 3.5 MIL/uL — ABNORMAL LOW (ref 4.22–5.81)
RDW: 14.1 % (ref 11.5–15.5)
RDW: 14 % (ref 11.5–15.5)
WBC: 10.7 10*3/uL — ABNORMAL HIGH (ref 4.0–10.5)
WBC: 12.4 10*3/uL — ABNORMAL HIGH (ref 4.0–10.5)
nRBC: 0 % (ref 0.0–0.2)
nRBC: 0 % (ref 0.0–0.2)

## 2018-07-15 LAB — VANCOMYCIN, TROUGH: Vancomycin Tr: 15 ug/mL (ref 15–20)

## 2018-07-15 LAB — LACTIC ACID, PLASMA: Lactic Acid, Venous: 0.8 mmol/L (ref 0.5–1.9)

## 2018-07-15 MED ORDER — SODIUM CHLORIDE 0.9 % IV SOLN
INTRAVENOUS | Status: DC
  Administered 2018-07-15: 22:00:00 via INTRAVENOUS

## 2018-07-15 MED ORDER — FENTANYL CITRATE (PF) 100 MCG/2ML IJ SOLN
50.0000 ug | INTRAMUSCULAR | Status: DC | PRN
  Administered 2018-07-15: 50 ug via INTRAVENOUS
  Filled 2018-07-15: qty 2

## 2018-07-15 NOTE — ED Notes (Signed)
Called [employer] for Orthopedics for Dr. Sabra Heck

## 2018-07-15 NOTE — ED Triage Notes (Signed)
Patient present to the ED via [facility] due to increased left hip pain.  Patient states he had surgery on the left hip Jun 12 2018 due to an old "sports injury".

## 2018-07-15 NOTE — ED Provider Notes (Signed)
[facility] Provider Note   CSN: [id number] Arrival date & time: 07/15/18  1444    History   Chief Complaint Chief Complaint  Patient presents with  . Hip Pain    left    HPI Roy [name] is a 58 y.o. male.     HPI  The patient is a 58 year old male, he has a known history of left-sided hip total hip arthroplasty in fact he has had bilateral hip arthroplasty, the left side was done in 2005.  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  He has had multiple abscesses and has had draining sinuses posterior to the hip wound.  During his admission to the hospital he had a hip revision with a spacer placed and states that initially he had done well partially ambulatory, developed an ileus but that has now resolved.  He has noted over the last several days he has had increasing thigh pain on the left as well as hip pain on the left, shortening of his leg with some external rotation of the leg which is unusual.  He has not been able to be out of bed to bear weight, he is able to get to a bedside commode as well as to a wheelchair but because of increasing pain, swelling and deformity of the leg he presents to the hospital.  He reports that he has been unable to get a hold of the physicians at [facility].  The symptoms are persistent, gradually worsening, he states that he has had some intermittent fever yesterday 100.9, today 99.6.  He is getting intravenous antibiotics through a PICC line in his left upper extremity.  He is currently getting vancomycin through June 25 as well as cefepime through June 25.  Past Medical History:  Diagnosis Date  . Abscess 02/27/2017   LEFT THIGH  . Hypertension     Patient Active Problem List   Diagnosis Date Noted  . Abscess of left thigh 02/27/2017    Past Surgical History:  Procedure Laterality Date  . IRRIGATION AND DEBRIDEMENT ABSCESS Left  02/28/2017   Procedure: IRRIGATION AND DEBRIDEMENT ABSCESS/THIGH;  Surgeon: Kinsinger, De BlanchLuke Aaron, MD;  [facility];  Service: General;  Laterality: Left;  . JOINT REPLACEMENT          Home Medications    Prior to Admission medications   Medication Sig Start Date End Date Taking? Authorizing Provider  amLODipine (NORVASC) 10 MG tablet Take 1 tablet (10 mg total) by mouth daily. Patient taking differently: Take 10 mg by mouth every morning.  03/02/17  Yes Meuth, Brooke A, PA-C  enoxaparin (LOVENOX) 40 MG/0.4ML injection Inject 40 mg into the skin daily. 13 days starting on 07/03/2018 07/03/18  Yes [provider]  Liniments (SALONPAS PAIN RELIEF PATCH EX) Apply 1 Package topically daily as needed (for pain).   Yes [provider]  Nebivolol HCl (BYSTOLIC) 20 MG TABS Take 20 mg by mouth daily.   Yes [provider]  pantoprazole (PROTONIX) 40 MG tablet Take 40 mg by mouth daily.  07/03/18 08/02/18 Yes [provider]  sacubitril-valsartan (ENTRESTO) 97-103 MG Take 1 tablet by mouth 2 (two) times daily.   Yes [provider]  spironolactone (ALDACTONE) 25 MG tablet Take 25 mg by mouth daily.   Yes [provider]  traMADol (ULTRAM) 50 MG tablet Take 25-50 mg by mouth every 4 (four) hours as  needed for moderate pain or severe pain.  07/03/18  Yes [provider]  Vancomycin HCl in NaCl 1-0.9 GM/250ML-% SOLN Inject 1 g into the vein every 12 (twelve) hours. 22 days starting on 07/03/2018 07/03/18 07/25/18 Yes [provider]    Family History No family history on file.  Social History Social History   Tobacco Use  . Smoking status: Never Smoker  . Smokeless tobacco: Never Used  Substance Use Topics  . Alcohol use: Yes    Comment: OCCASIONAL      . Drug use: No     Allergies   Patient has no known allergies.   Review of Systems Review of Systems  All other systems reviewed and are negative.    Physical Exam Updated  Vital Signs BP (!) 160/97   Pulse 88   Temp 98.2 F (36.8 C) (Oral)   Resp 15   Ht 1.854 m (6\' 1" )   Wt (!) 138.3 kg   SpO2 97%   BMI 40.24 kg/m   Physical Exam Vitals signs and nursing note reviewed.  Constitutional:      General: He is not in acute distress.    Appearance: He is well-developed. He is not ill-appearing, toxic-appearing or diaphoretic.  HENT:     Head: Normocephalic and atraumatic.     Mouth/Throat:     Pharynx: No oropharyngeal exudate.  Eyes:     General: No scleral icterus.       Right eye: No discharge.        Left eye: No discharge.     Conjunctiva/sclera: Conjunctivae normal.     Pupils: Pupils are equal, round, and reactive to light.  Neck:     Musculoskeletal: Normal range of motion and neck supple.     Thyroid: No thyromegaly.     Vascular: No JVD.  Cardiovascular:     Rate and Rhythm: Normal rate and regular rhythm.     Heart sounds: Normal heart sounds. No murmur. No friction rub. No gallop.   Pulmonary:     Effort: Pulmonary effort is normal. No respiratory distress.     Breath sounds: Normal breath sounds. No wheezing or rales.  Abdominal:     General: Bowel sounds are normal. There is no distension.     Palpations: Abdomen is soft. There is no mass.     Tenderness: There is no abdominal tenderness.  Musculoskeletal: Normal range of motion.        General: Swelling and tenderness present.     Comments: There is no pitting edema of the legs however his left leg is shortened and externally rotated.  He has slight increased swelling of the thigh  Lymphadenopathy:     Cervical: No cervical adenopathy.  Skin:    General: Skin is warm and dry.     Findings: No erythema or rash.  Neurological:     Mental Status: He is alert.     Coordination: Coordination normal.     Comments: There is weakness of the left leg but it is related to pain, he is able to lift the leg off the bed but only by an inch or 2 all other extremities seem to have normal  strength and range of motion  Psychiatric:        Behavior: Behavior normal.      ED Treatments / Results  Labs (all labs ordered are listed, but only abnormal results are displayed) Labs Reviewed  CBC WITH DIFFERENTIAL/PLATELET - Abnormal; Notable for the  following components:      Result Value   WBC 12.4 (*)    RBC 3.50 (*)    Hemoglobin 9.5 (*)    HCT 30.7 (*)    Neutro Abs 9.1 (*)    Monocytes Absolute 1.9 (*)    All other components within normal limits  COMPREHENSIVE METABOLIC PANEL - Abnormal; Notable for the following components:   Glucose, Bld 113 (*)    Albumin 2.8 (*)    All other components within normal limits  LACTIC ACID, PLASMA    EKG None  Radiology Dg Knee 1-2 Views Left  Result Date: 07/15/2018 CLINICAL DATA:  Left knee pain.  History of recent left hip surgery. EXAM: LEFT KNEE - 1-2 VIEW COMPARISON:  None. FINDINGS: Examination is limited due to poor positioning and no true lateral film. Moderate knee joint degenerative changes are noted. No acute bony findings or destructive bony changes. Suspect suprapatellar knee joint effusion. IMPRESSION: 1. Limited examination due to poor positioning. 2. Knee joint degenerative changes but no acute bony findings. 3. Suspect suprapatellar knee joint effusion. Electronically Signed   By: Rudie MeyerP.  Gallerani M.D.   On: 07/15/2018 18:05   [place] Venous Img Lower Unilateral Left  Result Date: 07/15/2018 CLINICAL DATA:  Left hip pain.  Recent surgery. EXAM: Left LOWER EXTREMITY VENOUS DOPPLER ULTRASOUND TECHNIQUE: Gray-scale sonography with graded compression, as well as color Doppler and duplex ultrasound were performed to evaluate the lower extremity deep venous systems from the level of the common femoral vein and including the common femoral, femoral, profunda femoral, popliteal and calf veins including the posterior tibial, peroneal and gastrocnemius veins when visible. The superficial great saphenous vein was also interrogated.  Spectral Doppler was utilized to evaluate flow at rest and with distal augmentation maneuvers in the common femoral, femoral and popliteal veins. COMPARISON:  None. FINDINGS: Contralateral Common Femoral Vein: Respiratory phasicity is normal and symmetric with the symptomatic side. No evidence of thrombus. Normal compressibility. Common Femoral Vein: No evidence of thrombus. Normal compressibility, respiratory phasicity and response to augmentation. Saphenofemoral Junction: No evidence of thrombus. Normal compressibility and flow on color Doppler imaging. Profunda Femoral Vein: No evidence of thrombus. Normal compressibility and flow on color Doppler imaging. Femoral Vein: No evidence of thrombus. Normal compressibility, respiratory phasicity and response to augmentation. Popliteal Vein: No evidence of thrombus. Normal compressibility, respiratory phasicity and response to augmentation. Calf Veins: No evidence of thrombus. Normal compressibility and flow on color Doppler imaging. Superficial Great Saphenous Vein: No evidence of thrombus. Normal compressibility. Other Findings:  None. IMPRESSION: No evidence of deep venous thrombosis. Electronically Signed   By: Maisie Fushomas  Register   On: 07/15/2018 16:02   Dg Chest Port 1 View  Result Date: 07/15/2018 CLINICAL DATA:  Deformity status post surgery. EXAM: PORTABLE CHEST 1 VIEW COMPARISON:  02/28/2017 FINDINGS: There is a left-sided PICC line in place with tip terminating over the proximal to mid SVC. The left hemidiaphragm is elevated. There is no pneumothorax. Appears to be some bibasilar atelectasis. No large focal area of consolidation. The heart size is enlarged but stable from prior study. IMPRESSION: 1. Well-positioned left-sided PICC line. 2. Nonspecific elevation of the left hemidiaphragm. 3. No acute cardiopulmonary process. Electronically Signed   By: Katherine Mantlehristopher  Green M.D.   On: 07/15/2018 17:04   Dg Hip Unilat W Or Wo Pelvis 2-3 Views Left  Result  Date: 07/15/2018 CLINICAL DATA:  Left hip pain. Recent left hip surgery with infection. EXAM: DG HIP (WITH OR WITHOUT PELVIS)  2-3V LEFT COMPARISON:  None. FINDINGS: Right hip replacement in satisfactory position alignment Resection of left femoral head. Cement is present within the region of the femoral head. There is a fracture deformity of the femoral neck with lateral displacement of the femur. There is cement within the proximal femur as well as a threaded rod. There is chronic periosteal new bone formation medial to the proximal femur. These findings are likely chronic. Unfortunately no prior studies available for comparison. IMPRESSION: Displaced fracture proximal left femur with resection of the femoral head. Findings are likely chronic however no prior studies available for comparison. Electronically Signed   By: Franchot Gallo M.D.   On: 07/15/2018 17:41    Procedures Procedures (including critical care time)  Medications Ordered in ED Medications - No data to display   Initial Impression / Assessment and Plan / ED Course  I have reviewed the triage vital signs and the nursing notes.  Pertinent labs & imaging results that were available during my care of the patient were reviewed by me and considered in my medical decision making (see chart for details).  Clinical Course as of Jul 14 2104  Mon Jul 15, 2018  2102 Gust the care with the transport coordinator who states that orthopedics has accepted the patient, Dr.Anakwenze orthopedics.  Patient will be transferred.   [BM]    Clinical Course User Index [BM] Noemi Chapel, MD       Due to the increased swelling pain and deformity I suspect that there is a prosthetic hip or spacer problem, he may have a DVT though he states he is getting daily Lovenox.  He only brought his blood pressure medicines with him.  I have reviewed the medical record including the outpatient and outside hospital notes.  Final Clinical Impressions(s) / ED  Diagnoses   Final diagnoses:  Closed fracture of left hip, initial encounter [facility])    ED Discharge Orders    None       Noemi Chapel, MD 07/15/18 2106

## 2018-07-15 NOTE — ED Notes (Signed)
MYA [phone] for updates

## 2018-07-16 DIAGNOSIS — D72829 Elevated white blood cell count, unspecified: Secondary | ICD-10-CM | POA: Diagnosis not present

## 2018-07-16 DIAGNOSIS — M25421 Effusion, right elbow: Secondary | ICD-10-CM | POA: Diagnosis not present

## 2018-07-16 DIAGNOSIS — T8484XA Pain due to internal orthopedic prosthetic devices, implants and grafts, initial encounter: Secondary | ICD-10-CM | POA: Diagnosis not present

## 2018-07-16 DIAGNOSIS — K219 Gastro-esophageal reflux disease without esophagitis: Secondary | ICD-10-CM | POA: Diagnosis not present

## 2018-07-16 DIAGNOSIS — M1000 Idiopathic gout, unspecified site: Secondary | ICD-10-CM | POA: Diagnosis not present

## 2018-07-16 DIAGNOSIS — X58XXXA Exposure to other specified factors, initial encounter: Secondary | ICD-10-CM | POA: Diagnosis not present

## 2018-07-16 DIAGNOSIS — R2242 Localized swelling, mass and lump, left lower limb: Secondary | ICD-10-CM | POA: Diagnosis not present

## 2018-07-16 DIAGNOSIS — M25462 Effusion, left knee: Secondary | ICD-10-CM | POA: Diagnosis not present

## 2018-07-16 DIAGNOSIS — Z79899 Other long term (current) drug therapy: Secondary | ICD-10-CM | POA: Diagnosis not present

## 2018-07-16 DIAGNOSIS — T8484XD Pain due to internal orthopedic prosthetic devices, implants and grafts, subsequent encounter: Secondary | ICD-10-CM | POA: Diagnosis not present

## 2018-07-16 DIAGNOSIS — M25562 Pain in left knee: Secondary | ICD-10-CM | POA: Diagnosis not present

## 2018-07-16 DIAGNOSIS — Y33XXXA Other specified events, undetermined intent, initial encounter: Secondary | ICD-10-CM | POA: Diagnosis not present

## 2018-07-16 DIAGNOSIS — M7989 Other specified soft tissue disorders: Secondary | ICD-10-CM | POA: Diagnosis not present

## 2018-07-16 DIAGNOSIS — Z96649 Presence of unspecified artificial hip joint: Secondary | ICD-10-CM | POA: Diagnosis not present

## 2018-07-16 DIAGNOSIS — Z8619 Personal history of other infectious and parasitic diseases: Secondary | ICD-10-CM | POA: Diagnosis not present

## 2018-07-16 DIAGNOSIS — M8589 Other specified disorders of bone density and structure, multiple sites: Secondary | ICD-10-CM | POA: Diagnosis not present

## 2018-07-16 DIAGNOSIS — I129 Hypertensive chronic kidney disease with stage 1 through stage 4 chronic kidney disease, or unspecified chronic kidney disease: Secondary | ICD-10-CM | POA: Diagnosis not present

## 2018-07-16 DIAGNOSIS — R5381 Other malaise: Secondary | ICD-10-CM | POA: Diagnosis not present

## 2018-07-16 DIAGNOSIS — N179 Acute kidney failure, unspecified: Secondary | ICD-10-CM | POA: Diagnosis not present

## 2018-07-16 DIAGNOSIS — T8459XS Infection and inflammatory reaction due to other internal joint prosthesis, sequela: Secondary | ICD-10-CM | POA: Diagnosis not present

## 2018-07-16 DIAGNOSIS — R509 Fever, unspecified: Secondary | ICD-10-CM | POA: Diagnosis not present

## 2018-07-16 DIAGNOSIS — Z8249 Family history of ischemic heart disease and other diseases of the circulatory system: Secondary | ICD-10-CM | POA: Diagnosis not present

## 2018-07-16 DIAGNOSIS — Y929 Unspecified place or not applicable: Secondary | ICD-10-CM | POA: Diagnosis not present

## 2018-07-16 DIAGNOSIS — T8459XA Infection and inflammatory reaction due to other internal joint prosthesis, initial encounter: Secondary | ICD-10-CM | POA: Diagnosis not present

## 2018-07-16 DIAGNOSIS — Y999 Unspecified external cause status: Secondary | ICD-10-CM | POA: Diagnosis not present

## 2018-07-16 DIAGNOSIS — E669 Obesity, unspecified: Secondary | ICD-10-CM | POA: Diagnosis not present

## 2018-07-16 DIAGNOSIS — J9811 Atelectasis: Secondary | ICD-10-CM | POA: Diagnosis not present

## 2018-07-16 DIAGNOSIS — Z96643 Presence of artificial hip joint, bilateral: Secondary | ICD-10-CM | POA: Diagnosis not present

## 2018-07-16 DIAGNOSIS — M19042 Primary osteoarthritis, left hand: Secondary | ICD-10-CM | POA: Diagnosis not present

## 2018-07-16 DIAGNOSIS — Z96641 Presence of right artificial hip joint: Secondary | ICD-10-CM | POA: Diagnosis not present

## 2018-07-16 DIAGNOSIS — Z833 Family history of diabetes mellitus: Secondary | ICD-10-CM | POA: Diagnosis not present

## 2018-07-16 DIAGNOSIS — I10 Essential (primary) hypertension: Secondary | ICD-10-CM | POA: Diagnosis not present

## 2018-07-16 DIAGNOSIS — M009 Pyogenic arthritis, unspecified: Secondary | ICD-10-CM | POA: Diagnosis not present

## 2018-07-16 DIAGNOSIS — M25569 Pain in unspecified knee: Secondary | ICD-10-CM | POA: Diagnosis not present

## 2018-07-16 DIAGNOSIS — T8459XD Infection and inflammatory reaction due to other internal joint prosthesis, subsequent encounter: Secondary | ICD-10-CM | POA: Diagnosis not present

## 2018-07-16 DIAGNOSIS — M13862 Other specified arthritis, left knee: Secondary | ICD-10-CM | POA: Diagnosis not present

## 2018-07-16 DIAGNOSIS — M00821 Arthritis due to other bacteria, right elbow: Secondary | ICD-10-CM | POA: Diagnosis not present

## 2018-07-16 DIAGNOSIS — S79002A Unspecified physeal fracture of upper end of left femur, initial encounter for closed fracture: Secondary | ICD-10-CM | POA: Diagnosis not present

## 2018-07-16 DIAGNOSIS — E876 Hypokalemia: Secondary | ICD-10-CM | POA: Diagnosis not present

## 2018-07-16 DIAGNOSIS — M19041 Primary osteoarthritis, right hand: Secondary | ICD-10-CM | POA: Diagnosis not present

## 2018-07-16 DIAGNOSIS — N189 Chronic kidney disease, unspecified: Secondary | ICD-10-CM | POA: Diagnosis not present

## 2018-07-16 DIAGNOSIS — M109 Gout, unspecified: Secondary | ICD-10-CM | POA: Diagnosis not present

## 2018-07-16 DIAGNOSIS — R700 Elevated erythrocyte sedimentation rate: Secondary | ICD-10-CM | POA: Diagnosis not present

## 2018-07-16 DIAGNOSIS — M1990 Unspecified osteoarthritis, unspecified site: Secondary | ICD-10-CM | POA: Diagnosis not present

## 2018-07-16 DIAGNOSIS — T8452XA Infection and inflammatory reaction due to internal left hip prosthesis, initial encounter: Secondary | ICD-10-CM | POA: Diagnosis not present

## 2018-07-16 DIAGNOSIS — Z6841 Body Mass Index (BMI) 40.0 and over, adult: Secondary | ICD-10-CM | POA: Diagnosis not present

## 2018-07-16 DIAGNOSIS — S79912A Unspecified injury of left hip, initial encounter: Secondary | ICD-10-CM | POA: Diagnosis not present

## 2018-07-16 DIAGNOSIS — A0472 Enterocolitis due to Clostridium difficile, not specified as recurrent: Secondary | ICD-10-CM | POA: Diagnosis not present

## 2018-07-16 DIAGNOSIS — M25552 Pain in left hip: Secondary | ICD-10-CM | POA: Diagnosis not present

## 2018-07-16 DIAGNOSIS — M8580 Other specified disorders of bone density and structure, unspecified site: Secondary | ICD-10-CM | POA: Diagnosis not present

## 2018-07-16 DIAGNOSIS — Z7401 Bed confinement status: Secondary | ICD-10-CM | POA: Diagnosis not present

## 2018-07-16 DIAGNOSIS — Z9889 Other specified postprocedural states: Secondary | ICD-10-CM | POA: Diagnosis not present

## 2018-07-16 DIAGNOSIS — Z96642 Presence of left artificial hip joint: Secondary | ICD-10-CM | POA: Diagnosis not present

## 2018-07-16 DIAGNOSIS — M6281 Muscle weakness (generalized): Secondary | ICD-10-CM | POA: Diagnosis not present

## 2018-07-17 DIAGNOSIS — T8484XA Pain due to internal orthopedic prosthetic devices, implants and grafts, initial encounter: Secondary | ICD-10-CM | POA: Diagnosis not present

## 2018-07-17 DIAGNOSIS — M009 Pyogenic arthritis, unspecified: Secondary | ICD-10-CM | POA: Diagnosis not present

## 2018-07-17 DIAGNOSIS — T8459XS Infection and inflammatory reaction due to other internal joint prosthesis, sequela: Secondary | ICD-10-CM | POA: Diagnosis not present

## 2018-07-17 DIAGNOSIS — T8459XA Infection and inflammatory reaction due to other internal joint prosthesis, initial encounter: Secondary | ICD-10-CM | POA: Diagnosis not present

## 2018-07-17 DIAGNOSIS — Z96649 Presence of unspecified artificial hip joint: Secondary | ICD-10-CM | POA: Diagnosis not present

## 2018-07-18 DIAGNOSIS — M009 Pyogenic arthritis, unspecified: Secondary | ICD-10-CM | POA: Diagnosis not present

## 2018-07-18 DIAGNOSIS — T8459XA Infection and inflammatory reaction due to other internal joint prosthesis, initial encounter: Secondary | ICD-10-CM | POA: Diagnosis not present

## 2018-07-18 DIAGNOSIS — Z96649 Presence of unspecified artificial hip joint: Secondary | ICD-10-CM | POA: Diagnosis not present

## 2018-07-18 DIAGNOSIS — T8484XA Pain due to internal orthopedic prosthetic devices, implants and grafts, initial encounter: Secondary | ICD-10-CM | POA: Diagnosis not present

## 2018-07-19 DIAGNOSIS — M009 Pyogenic arthritis, unspecified: Secondary | ICD-10-CM | POA: Diagnosis not present

## 2018-07-19 DIAGNOSIS — Z96649 Presence of unspecified artificial hip joint: Secondary | ICD-10-CM | POA: Diagnosis not present

## 2018-07-19 DIAGNOSIS — T8459XS Infection and inflammatory reaction due to other internal joint prosthesis, sequela: Secondary | ICD-10-CM | POA: Diagnosis not present

## 2018-07-19 DIAGNOSIS — T8459XA Infection and inflammatory reaction due to other internal joint prosthesis, initial encounter: Secondary | ICD-10-CM | POA: Diagnosis not present

## 2018-07-19 DIAGNOSIS — M1000 Idiopathic gout, unspecified site: Secondary | ICD-10-CM | POA: Diagnosis not present

## 2018-07-19 DIAGNOSIS — T8484XA Pain due to internal orthopedic prosthetic devices, implants and grafts, initial encounter: Secondary | ICD-10-CM | POA: Diagnosis not present

## 2018-07-20 DIAGNOSIS — T8459XS Infection and inflammatory reaction due to other internal joint prosthesis, sequela: Secondary | ICD-10-CM | POA: Diagnosis not present

## 2018-07-20 DIAGNOSIS — M19042 Primary osteoarthritis, left hand: Secondary | ICD-10-CM | POA: Diagnosis not present

## 2018-07-20 DIAGNOSIS — M1000 Idiopathic gout, unspecified site: Secondary | ICD-10-CM | POA: Diagnosis not present

## 2018-07-20 DIAGNOSIS — Z96649 Presence of unspecified artificial hip joint: Secondary | ICD-10-CM | POA: Diagnosis not present

## 2018-07-20 DIAGNOSIS — M25462 Effusion, left knee: Secondary | ICD-10-CM | POA: Diagnosis not present

## 2018-07-20 DIAGNOSIS — M009 Pyogenic arthritis, unspecified: Secondary | ICD-10-CM | POA: Diagnosis not present

## 2018-07-20 DIAGNOSIS — M109 Gout, unspecified: Secondary | ICD-10-CM | POA: Insufficient documentation

## 2018-07-20 DIAGNOSIS — Z6841 Body Mass Index (BMI) 40.0 and over, adult: Secondary | ICD-10-CM | POA: Diagnosis not present

## 2018-07-20 DIAGNOSIS — M19041 Primary osteoarthritis, right hand: Secondary | ICD-10-CM | POA: Diagnosis not present

## 2018-07-20 DIAGNOSIS — M25569 Pain in unspecified knee: Secondary | ICD-10-CM | POA: Diagnosis not present

## 2018-07-20 DIAGNOSIS — T8484XA Pain due to internal orthopedic prosthetic devices, implants and grafts, initial encounter: Secondary | ICD-10-CM | POA: Diagnosis not present

## 2018-07-21 DIAGNOSIS — M1000 Idiopathic gout, unspecified site: Secondary | ICD-10-CM | POA: Diagnosis not present

## 2018-07-21 DIAGNOSIS — Z6841 Body Mass Index (BMI) 40.0 and over, adult: Secondary | ICD-10-CM | POA: Diagnosis not present

## 2018-07-21 DIAGNOSIS — T8484XA Pain due to internal orthopedic prosthetic devices, implants and grafts, initial encounter: Secondary | ICD-10-CM | POA: Diagnosis not present

## 2018-07-21 DIAGNOSIS — T8459XS Infection and inflammatory reaction due to other internal joint prosthesis, sequela: Secondary | ICD-10-CM | POA: Diagnosis not present

## 2018-07-22 DIAGNOSIS — Z96649 Presence of unspecified artificial hip joint: Secondary | ICD-10-CM | POA: Diagnosis not present

## 2018-07-22 DIAGNOSIS — M1000 Idiopathic gout, unspecified site: Secondary | ICD-10-CM | POA: Diagnosis not present

## 2018-07-22 DIAGNOSIS — T8459XS Infection and inflammatory reaction due to other internal joint prosthesis, sequela: Secondary | ICD-10-CM | POA: Diagnosis not present

## 2018-07-22 DIAGNOSIS — Z6841 Body Mass Index (BMI) 40.0 and over, adult: Secondary | ICD-10-CM | POA: Diagnosis not present

## 2018-07-22 DIAGNOSIS — T8484XA Pain due to internal orthopedic prosthetic devices, implants and grafts, initial encounter: Secondary | ICD-10-CM | POA: Diagnosis not present

## 2018-07-22 DIAGNOSIS — M009 Pyogenic arthritis, unspecified: Secondary | ICD-10-CM | POA: Diagnosis not present

## 2018-07-23 DIAGNOSIS — M1000 Idiopathic gout, unspecified site: Secondary | ICD-10-CM | POA: Diagnosis not present

## 2018-07-23 DIAGNOSIS — T8484XA Pain due to internal orthopedic prosthetic devices, implants and grafts, initial encounter: Secondary | ICD-10-CM | POA: Diagnosis not present

## 2018-07-23 DIAGNOSIS — T8459XS Infection and inflammatory reaction due to other internal joint prosthesis, sequela: Secondary | ICD-10-CM | POA: Diagnosis not present

## 2018-07-23 DIAGNOSIS — Z96649 Presence of unspecified artificial hip joint: Secondary | ICD-10-CM | POA: Diagnosis not present

## 2018-07-23 DIAGNOSIS — Z6841 Body Mass Index (BMI) 40.0 and over, adult: Secondary | ICD-10-CM | POA: Diagnosis not present

## 2018-07-24 DIAGNOSIS — M009 Pyogenic arthritis, unspecified: Secondary | ICD-10-CM | POA: Diagnosis not present

## 2018-07-24 DIAGNOSIS — Z6841 Body Mass Index (BMI) 40.0 and over, adult: Secondary | ICD-10-CM | POA: Diagnosis not present

## 2018-07-24 DIAGNOSIS — T8484XA Pain due to internal orthopedic prosthetic devices, implants and grafts, initial encounter: Secondary | ICD-10-CM | POA: Diagnosis not present

## 2018-07-24 DIAGNOSIS — M1000 Idiopathic gout, unspecified site: Secondary | ICD-10-CM | POA: Diagnosis not present

## 2018-07-24 DIAGNOSIS — T8459XS Infection and inflammatory reaction due to other internal joint prosthesis, sequela: Secondary | ICD-10-CM | POA: Diagnosis not present

## 2018-07-25 DIAGNOSIS — M1000 Idiopathic gout, unspecified site: Secondary | ICD-10-CM | POA: Diagnosis not present

## 2018-07-25 DIAGNOSIS — T8459XS Infection and inflammatory reaction due to other internal joint prosthesis, sequela: Secondary | ICD-10-CM | POA: Diagnosis not present

## 2018-07-25 DIAGNOSIS — Z6841 Body Mass Index (BMI) 40.0 and over, adult: Secondary | ICD-10-CM | POA: Diagnosis not present

## 2018-07-25 DIAGNOSIS — T8484XA Pain due to internal orthopedic prosthetic devices, implants and grafts, initial encounter: Secondary | ICD-10-CM | POA: Diagnosis not present

## 2018-07-26 DIAGNOSIS — T8484XD Pain due to internal orthopedic prosthetic devices, implants and grafts, subsequent encounter: Secondary | ICD-10-CM | POA: Diagnosis not present

## 2018-07-26 DIAGNOSIS — M6281 Muscle weakness (generalized): Secondary | ICD-10-CM | POA: Diagnosis not present

## 2018-07-26 DIAGNOSIS — K219 Gastro-esophageal reflux disease without esophagitis: Secondary | ICD-10-CM | POA: Diagnosis not present

## 2018-07-26 DIAGNOSIS — N179 Acute kidney failure, unspecified: Secondary | ICD-10-CM | POA: Insufficient documentation

## 2018-07-26 DIAGNOSIS — M009 Pyogenic arthritis, unspecified: Secondary | ICD-10-CM | POA: Diagnosis not present

## 2018-07-26 DIAGNOSIS — K567 Ileus, unspecified: Secondary | ICD-10-CM | POA: Diagnosis not present

## 2018-07-26 DIAGNOSIS — K929 Disease of digestive system, unspecified: Secondary | ICD-10-CM | POA: Insufficient documentation

## 2018-07-26 DIAGNOSIS — Z96643 Presence of artificial hip joint, bilateral: Secondary | ICD-10-CM | POA: Diagnosis not present

## 2018-07-26 DIAGNOSIS — M1611 Unilateral primary osteoarthritis, right hip: Secondary | ICD-10-CM | POA: Diagnosis not present

## 2018-07-26 DIAGNOSIS — A0472 Enterocolitis due to Clostridium difficile, not specified as recurrent: Secondary | ICD-10-CM | POA: Diagnosis not present

## 2018-07-26 DIAGNOSIS — M25552 Pain in left hip: Secondary | ICD-10-CM | POA: Diagnosis not present

## 2018-07-26 DIAGNOSIS — M8580 Other specified disorders of bone density and structure, unspecified site: Secondary | ICD-10-CM | POA: Diagnosis not present

## 2018-07-26 DIAGNOSIS — M109 Gout, unspecified: Secondary | ICD-10-CM | POA: Diagnosis not present

## 2018-07-26 DIAGNOSIS — T8459XS Infection and inflammatory reaction due to other internal joint prosthesis, sequela: Secondary | ICD-10-CM | POA: Diagnosis not present

## 2018-07-26 DIAGNOSIS — M25562 Pain in left knee: Secondary | ICD-10-CM | POA: Diagnosis not present

## 2018-07-26 DIAGNOSIS — N189 Chronic kidney disease, unspecified: Secondary | ICD-10-CM | POA: Diagnosis not present

## 2018-07-26 DIAGNOSIS — K56609 Unspecified intestinal obstruction, unspecified as to partial versus complete obstruction: Secondary | ICD-10-CM | POA: Insufficient documentation

## 2018-07-26 DIAGNOSIS — R5381 Other malaise: Secondary | ICD-10-CM | POA: Insufficient documentation

## 2018-07-26 DIAGNOSIS — Z4732 Aftercare following explantation of hip joint prosthesis: Secondary | ICD-10-CM | POA: Insufficient documentation

## 2018-07-26 DIAGNOSIS — Z6841 Body Mass Index (BMI) 40.0 and over, adult: Secondary | ICD-10-CM | POA: Insufficient documentation

## 2018-07-26 DIAGNOSIS — T8452XD Infection and inflammatory reaction due to internal left hip prosthesis, subsequent encounter: Secondary | ICD-10-CM | POA: Diagnosis not present

## 2018-07-26 DIAGNOSIS — E639 Nutritional deficiency, unspecified: Secondary | ICD-10-CM | POA: Insufficient documentation

## 2018-07-26 DIAGNOSIS — E876 Hypokalemia: Secondary | ICD-10-CM | POA: Insufficient documentation

## 2018-07-26 DIAGNOSIS — M8588 Other specified disorders of bone density and structure, other site: Secondary | ICD-10-CM | POA: Diagnosis not present

## 2018-07-26 DIAGNOSIS — M1000 Idiopathic gout, unspecified site: Secondary | ICD-10-CM | POA: Diagnosis not present

## 2018-07-26 DIAGNOSIS — Z8619 Personal history of other infectious and parasitic diseases: Secondary | ICD-10-CM | POA: Diagnosis not present

## 2018-07-26 DIAGNOSIS — T8459XD Infection and inflammatory reaction due to other internal joint prosthesis, subsequent encounter: Secondary | ICD-10-CM | POA: Diagnosis not present

## 2018-07-26 DIAGNOSIS — Z7401 Bed confinement status: Secondary | ICD-10-CM | POA: Diagnosis not present

## 2018-07-26 DIAGNOSIS — I10 Essential (primary) hypertension: Secondary | ICD-10-CM | POA: Diagnosis not present

## 2018-07-26 DIAGNOSIS — K9189 Other postprocedural complications and disorders of digestive system: Secondary | ICD-10-CM | POA: Diagnosis not present

## 2018-07-26 DIAGNOSIS — M899 Disorder of bone, unspecified: Secondary | ICD-10-CM | POA: Insufficient documentation

## 2018-07-26 DIAGNOSIS — B999 Unspecified infectious disease: Secondary | ICD-10-CM | POA: Insufficient documentation

## 2018-07-26 DIAGNOSIS — M10021 Idiopathic gout, right elbow: Secondary | ICD-10-CM | POA: Diagnosis not present

## 2018-07-26 DIAGNOSIS — M1990 Unspecified osteoarthritis, unspecified site: Secondary | ICD-10-CM | POA: Diagnosis not present

## 2018-07-26 DIAGNOSIS — L518 Other erythema multiforme: Secondary | ICD-10-CM | POA: Diagnosis not present

## 2018-07-26 DIAGNOSIS — Z79899 Other long term (current) drug therapy: Secondary | ICD-10-CM | POA: Diagnosis not present

## 2018-07-26 DIAGNOSIS — Z96642 Presence of left artificial hip joint: Secondary | ICD-10-CM | POA: Diagnosis not present

## 2018-07-26 DIAGNOSIS — D649 Anemia, unspecified: Secondary | ICD-10-CM | POA: Diagnosis not present

## 2018-07-26 DIAGNOSIS — Y831 Surgical operation with implant of artificial internal device as the cause of abnormal reaction of the patient, or of later complication, without mention of misadventure at the time of the procedure: Secondary | ICD-10-CM | POA: Diagnosis not present

## 2018-07-26 DIAGNOSIS — T8484XA Pain due to internal orthopedic prosthetic devices, implants and grafts, initial encounter: Secondary | ICD-10-CM | POA: Diagnosis not present

## 2018-07-27 DIAGNOSIS — M6281 Muscle weakness (generalized): Secondary | ICD-10-CM | POA: Insufficient documentation

## 2018-07-29 DIAGNOSIS — R5381 Other malaise: Secondary | ICD-10-CM | POA: Diagnosis not present

## 2018-07-30 DIAGNOSIS — T8459XD Infection and inflammatory reaction due to other internal joint prosthesis, subsequent encounter: Secondary | ICD-10-CM | POA: Diagnosis not present

## 2018-07-30 DIAGNOSIS — M10021 Idiopathic gout, right elbow: Secondary | ICD-10-CM | POA: Diagnosis not present

## 2018-07-30 DIAGNOSIS — R5381 Other malaise: Secondary | ICD-10-CM | POA: Diagnosis not present

## 2018-07-30 DIAGNOSIS — A0472 Enterocolitis due to Clostridium difficile, not specified as recurrent: Secondary | ICD-10-CM | POA: Diagnosis not present

## 2018-07-30 DIAGNOSIS — M1000 Idiopathic gout, unspecified site: Secondary | ICD-10-CM | POA: Diagnosis not present

## 2018-07-30 DIAGNOSIS — Y831 Surgical operation with implant of artificial internal device as the cause of abnormal reaction of the patient, or of later complication, without mention of misadventure at the time of the procedure: Secondary | ICD-10-CM | POA: Diagnosis not present

## 2018-07-31 DIAGNOSIS — M009 Pyogenic arthritis, unspecified: Secondary | ICD-10-CM | POA: Insufficient documentation

## 2018-08-01 DIAGNOSIS — R5381 Other malaise: Secondary | ICD-10-CM | POA: Diagnosis not present

## 2018-08-06 DIAGNOSIS — R5381 Other malaise: Secondary | ICD-10-CM | POA: Diagnosis not present

## 2018-08-08 DIAGNOSIS — D649 Anemia, unspecified: Secondary | ICD-10-CM | POA: Diagnosis not present

## 2018-08-08 DIAGNOSIS — K9189 Other postprocedural complications and disorders of digestive system: Secondary | ICD-10-CM | POA: Diagnosis not present

## 2018-08-08 DIAGNOSIS — M8588 Other specified disorders of bone density and structure, other site: Secondary | ICD-10-CM | POA: Diagnosis not present

## 2018-08-08 DIAGNOSIS — Z96643 Presence of artificial hip joint, bilateral: Secondary | ICD-10-CM | POA: Diagnosis not present

## 2018-08-08 DIAGNOSIS — Y831 Surgical operation with implant of artificial internal device as the cause of abnormal reaction of the patient, or of later complication, without mention of misadventure at the time of the procedure: Secondary | ICD-10-CM | POA: Diagnosis not present

## 2018-08-08 DIAGNOSIS — K567 Ileus, unspecified: Secondary | ICD-10-CM | POA: Diagnosis not present

## 2018-08-08 DIAGNOSIS — T8484XA Pain due to internal orthopedic prosthetic devices, implants and grafts, initial encounter: Secondary | ICD-10-CM | POA: Diagnosis not present

## 2018-08-08 DIAGNOSIS — Z6841 Body Mass Index (BMI) 40.0 and over, adult: Secondary | ICD-10-CM | POA: Diagnosis not present

## 2018-08-08 DIAGNOSIS — Z79899 Other long term (current) drug therapy: Secondary | ICD-10-CM | POA: Diagnosis not present

## 2018-08-08 DIAGNOSIS — T8452XD Infection and inflammatory reaction due to internal left hip prosthesis, subsequent encounter: Secondary | ICD-10-CM | POA: Diagnosis not present

## 2018-08-09 DIAGNOSIS — R5381 Other malaise: Secondary | ICD-10-CM | POA: Diagnosis not present

## 2018-08-15 DIAGNOSIS — R5381 Other malaise: Secondary | ICD-10-CM | POA: Diagnosis not present

## 2018-08-16 DIAGNOSIS — Z452 Encounter for adjustment and management of vascular access device: Secondary | ICD-10-CM | POA: Diagnosis not present

## 2018-08-16 DIAGNOSIS — M109 Gout, unspecified: Secondary | ICD-10-CM | POA: Diagnosis not present

## 2018-08-16 DIAGNOSIS — K567 Ileus, unspecified: Secondary | ICD-10-CM | POA: Diagnosis not present

## 2018-08-16 DIAGNOSIS — T8452XA Infection and inflammatory reaction due to internal left hip prosthesis, initial encounter: Secondary | ICD-10-CM | POA: Diagnosis not present

## 2018-08-16 DIAGNOSIS — Z96643 Presence of artificial hip joint, bilateral: Secondary | ICD-10-CM | POA: Diagnosis not present

## 2018-08-16 DIAGNOSIS — K9189 Other postprocedural complications and disorders of digestive system: Secondary | ICD-10-CM | POA: Diagnosis not present

## 2018-08-16 DIAGNOSIS — Z7982 Long term (current) use of aspirin: Secondary | ICD-10-CM | POA: Diagnosis not present

## 2018-08-16 DIAGNOSIS — I129 Hypertensive chronic kidney disease with stage 1 through stage 4 chronic kidney disease, or unspecified chronic kidney disease: Secondary | ICD-10-CM | POA: Diagnosis not present

## 2018-08-16 DIAGNOSIS — Z792 Long term (current) use of antibiotics: Secondary | ICD-10-CM | POA: Diagnosis not present

## 2018-08-16 DIAGNOSIS — N189 Chronic kidney disease, unspecified: Secondary | ICD-10-CM | POA: Diagnosis not present

## 2018-08-16 DIAGNOSIS — Z5181 Encounter for therapeutic drug level monitoring: Secondary | ICD-10-CM | POA: Diagnosis not present

## 2018-08-19 DIAGNOSIS — N189 Chronic kidney disease, unspecified: Secondary | ICD-10-CM | POA: Diagnosis not present

## 2018-08-19 DIAGNOSIS — M109 Gout, unspecified: Secondary | ICD-10-CM | POA: Diagnosis not present

## 2018-08-19 DIAGNOSIS — K9189 Other postprocedural complications and disorders of digestive system: Secondary | ICD-10-CM | POA: Diagnosis not present

## 2018-08-19 DIAGNOSIS — I129 Hypertensive chronic kidney disease with stage 1 through stage 4 chronic kidney disease, or unspecified chronic kidney disease: Secondary | ICD-10-CM | POA: Diagnosis not present

## 2018-08-19 DIAGNOSIS — Z96643 Presence of artificial hip joint, bilateral: Secondary | ICD-10-CM | POA: Diagnosis not present

## 2018-08-19 DIAGNOSIS — Z5181 Encounter for therapeutic drug level monitoring: Secondary | ICD-10-CM | POA: Diagnosis not present

## 2018-08-19 DIAGNOSIS — T8452XA Infection and inflammatory reaction due to internal left hip prosthesis, initial encounter: Secondary | ICD-10-CM | POA: Diagnosis not present

## 2018-08-19 DIAGNOSIS — Z452 Encounter for adjustment and management of vascular access device: Secondary | ICD-10-CM | POA: Diagnosis not present

## 2018-08-19 DIAGNOSIS — Z7982 Long term (current) use of aspirin: Secondary | ICD-10-CM | POA: Diagnosis not present

## 2018-08-19 DIAGNOSIS — Z792 Long term (current) use of antibiotics: Secondary | ICD-10-CM | POA: Diagnosis not present

## 2018-08-19 DIAGNOSIS — K567 Ileus, unspecified: Secondary | ICD-10-CM | POA: Diagnosis not present

## 2018-08-20 DIAGNOSIS — M109 Gout, unspecified: Secondary | ICD-10-CM | POA: Diagnosis not present

## 2018-08-20 DIAGNOSIS — K567 Ileus, unspecified: Secondary | ICD-10-CM | POA: Diagnosis not present

## 2018-08-20 DIAGNOSIS — Z792 Long term (current) use of antibiotics: Secondary | ICD-10-CM | POA: Diagnosis not present

## 2018-08-20 DIAGNOSIS — N189 Chronic kidney disease, unspecified: Secondary | ICD-10-CM | POA: Diagnosis not present

## 2018-08-20 DIAGNOSIS — Z5181 Encounter for therapeutic drug level monitoring: Secondary | ICD-10-CM | POA: Diagnosis not present

## 2018-08-20 DIAGNOSIS — K9189 Other postprocedural complications and disorders of digestive system: Secondary | ICD-10-CM | POA: Diagnosis not present

## 2018-08-20 DIAGNOSIS — T8452XA Infection and inflammatory reaction due to internal left hip prosthesis, initial encounter: Secondary | ICD-10-CM | POA: Diagnosis not present

## 2018-08-20 DIAGNOSIS — Z452 Encounter for adjustment and management of vascular access device: Secondary | ICD-10-CM | POA: Diagnosis not present

## 2018-08-20 DIAGNOSIS — Z7982 Long term (current) use of aspirin: Secondary | ICD-10-CM | POA: Diagnosis not present

## 2018-08-20 DIAGNOSIS — I129 Hypertensive chronic kidney disease with stage 1 through stage 4 chronic kidney disease, or unspecified chronic kidney disease: Secondary | ICD-10-CM | POA: Diagnosis not present

## 2018-08-20 DIAGNOSIS — Z96643 Presence of artificial hip joint, bilateral: Secondary | ICD-10-CM | POA: Diagnosis not present

## 2018-08-21 DIAGNOSIS — Z452 Encounter for adjustment and management of vascular access device: Secondary | ICD-10-CM | POA: Diagnosis not present

## 2018-08-21 DIAGNOSIS — I129 Hypertensive chronic kidney disease with stage 1 through stage 4 chronic kidney disease, or unspecified chronic kidney disease: Secondary | ICD-10-CM | POA: Diagnosis not present

## 2018-08-21 DIAGNOSIS — N189 Chronic kidney disease, unspecified: Secondary | ICD-10-CM | POA: Diagnosis not present

## 2018-08-21 DIAGNOSIS — K9189 Other postprocedural complications and disorders of digestive system: Secondary | ICD-10-CM | POA: Diagnosis not present

## 2018-08-21 DIAGNOSIS — K567 Ileus, unspecified: Secondary | ICD-10-CM | POA: Diagnosis not present

## 2018-08-21 DIAGNOSIS — Z7982 Long term (current) use of aspirin: Secondary | ICD-10-CM | POA: Diagnosis not present

## 2018-08-21 DIAGNOSIS — M109 Gout, unspecified: Secondary | ICD-10-CM | POA: Diagnosis not present

## 2018-08-21 DIAGNOSIS — Z792 Long term (current) use of antibiotics: Secondary | ICD-10-CM | POA: Diagnosis not present

## 2018-08-21 DIAGNOSIS — T8452XA Infection and inflammatory reaction due to internal left hip prosthesis, initial encounter: Secondary | ICD-10-CM | POA: Diagnosis not present

## 2018-08-21 DIAGNOSIS — Z96643 Presence of artificial hip joint, bilateral: Secondary | ICD-10-CM | POA: Diagnosis not present

## 2018-08-21 DIAGNOSIS — Z5181 Encounter for therapeutic drug level monitoring: Secondary | ICD-10-CM | POA: Diagnosis not present

## 2018-08-22 DIAGNOSIS — I10 Essential (primary) hypertension: Secondary | ICD-10-CM | POA: Diagnosis not present

## 2018-08-22 DIAGNOSIS — M10021 Idiopathic gout, right elbow: Secondary | ICD-10-CM | POA: Insufficient documentation

## 2018-08-26 DIAGNOSIS — M109 Gout, unspecified: Secondary | ICD-10-CM | POA: Diagnosis not present

## 2018-08-26 DIAGNOSIS — Z792 Long term (current) use of antibiotics: Secondary | ICD-10-CM | POA: Diagnosis not present

## 2018-08-26 DIAGNOSIS — Z5181 Encounter for therapeutic drug level monitoring: Secondary | ICD-10-CM | POA: Diagnosis not present

## 2018-08-26 DIAGNOSIS — N189 Chronic kidney disease, unspecified: Secondary | ICD-10-CM | POA: Diagnosis not present

## 2018-08-26 DIAGNOSIS — T8452XA Infection and inflammatory reaction due to internal left hip prosthesis, initial encounter: Secondary | ICD-10-CM | POA: Diagnosis not present

## 2018-08-26 DIAGNOSIS — K9189 Other postprocedural complications and disorders of digestive system: Secondary | ICD-10-CM | POA: Diagnosis not present

## 2018-08-26 DIAGNOSIS — Z7982 Long term (current) use of aspirin: Secondary | ICD-10-CM | POA: Diagnosis not present

## 2018-08-26 DIAGNOSIS — K567 Ileus, unspecified: Secondary | ICD-10-CM | POA: Diagnosis not present

## 2018-08-26 DIAGNOSIS — Z96643 Presence of artificial hip joint, bilateral: Secondary | ICD-10-CM | POA: Diagnosis not present

## 2018-08-26 DIAGNOSIS — Z452 Encounter for adjustment and management of vascular access device: Secondary | ICD-10-CM | POA: Diagnosis not present

## 2018-08-26 DIAGNOSIS — I129 Hypertensive chronic kidney disease with stage 1 through stage 4 chronic kidney disease, or unspecified chronic kidney disease: Secondary | ICD-10-CM | POA: Diagnosis not present

## 2018-08-28 DIAGNOSIS — M009 Pyogenic arthritis, unspecified: Secondary | ICD-10-CM | POA: Diagnosis not present

## 2018-08-29 DIAGNOSIS — Z792 Long term (current) use of antibiotics: Secondary | ICD-10-CM | POA: Diagnosis not present

## 2018-08-29 DIAGNOSIS — Z7982 Long term (current) use of aspirin: Secondary | ICD-10-CM | POA: Diagnosis not present

## 2018-08-29 DIAGNOSIS — Z96643 Presence of artificial hip joint, bilateral: Secondary | ICD-10-CM | POA: Diagnosis not present

## 2018-08-29 DIAGNOSIS — Z452 Encounter for adjustment and management of vascular access device: Secondary | ICD-10-CM | POA: Diagnosis not present

## 2018-08-29 DIAGNOSIS — M109 Gout, unspecified: Secondary | ICD-10-CM | POA: Diagnosis not present

## 2018-08-29 DIAGNOSIS — I129 Hypertensive chronic kidney disease with stage 1 through stage 4 chronic kidney disease, or unspecified chronic kidney disease: Secondary | ICD-10-CM | POA: Diagnosis not present

## 2018-08-29 DIAGNOSIS — Z5181 Encounter for therapeutic drug level monitoring: Secondary | ICD-10-CM | POA: Diagnosis not present

## 2018-08-29 DIAGNOSIS — K9189 Other postprocedural complications and disorders of digestive system: Secondary | ICD-10-CM | POA: Diagnosis not present

## 2018-08-29 DIAGNOSIS — N189 Chronic kidney disease, unspecified: Secondary | ICD-10-CM | POA: Diagnosis not present

## 2018-08-29 DIAGNOSIS — K567 Ileus, unspecified: Secondary | ICD-10-CM | POA: Diagnosis not present

## 2018-08-29 DIAGNOSIS — T8452XA Infection and inflammatory reaction due to internal left hip prosthesis, initial encounter: Secondary | ICD-10-CM | POA: Diagnosis not present

## 2018-08-30 DIAGNOSIS — M109 Gout, unspecified: Secondary | ICD-10-CM | POA: Diagnosis not present

## 2018-08-30 DIAGNOSIS — Z792 Long term (current) use of antibiotics: Secondary | ICD-10-CM | POA: Diagnosis not present

## 2018-08-30 DIAGNOSIS — N189 Chronic kidney disease, unspecified: Secondary | ICD-10-CM | POA: Diagnosis not present

## 2018-08-30 DIAGNOSIS — T8452XA Infection and inflammatory reaction due to internal left hip prosthesis, initial encounter: Secondary | ICD-10-CM | POA: Diagnosis not present

## 2018-08-30 DIAGNOSIS — Z7982 Long term (current) use of aspirin: Secondary | ICD-10-CM | POA: Diagnosis not present

## 2018-08-30 DIAGNOSIS — K567 Ileus, unspecified: Secondary | ICD-10-CM | POA: Diagnosis not present

## 2018-08-30 DIAGNOSIS — Z96643 Presence of artificial hip joint, bilateral: Secondary | ICD-10-CM | POA: Diagnosis not present

## 2018-08-30 DIAGNOSIS — Z5181 Encounter for therapeutic drug level monitoring: Secondary | ICD-10-CM | POA: Diagnosis not present

## 2018-08-30 DIAGNOSIS — I129 Hypertensive chronic kidney disease with stage 1 through stage 4 chronic kidney disease, or unspecified chronic kidney disease: Secondary | ICD-10-CM | POA: Diagnosis not present

## 2018-08-30 DIAGNOSIS — Z452 Encounter for adjustment and management of vascular access device: Secondary | ICD-10-CM | POA: Diagnosis not present

## 2018-08-30 DIAGNOSIS — K9189 Other postprocedural complications and disorders of digestive system: Secondary | ICD-10-CM | POA: Diagnosis not present

## 2018-09-02 DIAGNOSIS — T8452XA Infection and inflammatory reaction due to internal left hip prosthesis, initial encounter: Secondary | ICD-10-CM | POA: Diagnosis not present

## 2018-09-02 DIAGNOSIS — K567 Ileus, unspecified: Secondary | ICD-10-CM | POA: Diagnosis not present

## 2018-09-02 DIAGNOSIS — Z7982 Long term (current) use of aspirin: Secondary | ICD-10-CM | POA: Diagnosis not present

## 2018-09-02 DIAGNOSIS — Z792 Long term (current) use of antibiotics: Secondary | ICD-10-CM | POA: Diagnosis not present

## 2018-09-02 DIAGNOSIS — Z5181 Encounter for therapeutic drug level monitoring: Secondary | ICD-10-CM | POA: Diagnosis not present

## 2018-09-02 DIAGNOSIS — I129 Hypertensive chronic kidney disease with stage 1 through stage 4 chronic kidney disease, or unspecified chronic kidney disease: Secondary | ICD-10-CM | POA: Diagnosis not present

## 2018-09-02 DIAGNOSIS — Z452 Encounter for adjustment and management of vascular access device: Secondary | ICD-10-CM | POA: Diagnosis not present

## 2018-09-02 DIAGNOSIS — N189 Chronic kidney disease, unspecified: Secondary | ICD-10-CM | POA: Diagnosis not present

## 2018-09-02 DIAGNOSIS — K9189 Other postprocedural complications and disorders of digestive system: Secondary | ICD-10-CM | POA: Diagnosis not present

## 2018-09-02 DIAGNOSIS — Z96643 Presence of artificial hip joint, bilateral: Secondary | ICD-10-CM | POA: Diagnosis not present

## 2018-09-02 DIAGNOSIS — M109 Gout, unspecified: Secondary | ICD-10-CM | POA: Diagnosis not present

## 2018-09-04 DIAGNOSIS — M1611 Unilateral primary osteoarthritis, right hip: Secondary | ICD-10-CM | POA: Diagnosis not present

## 2018-09-04 DIAGNOSIS — M1000 Idiopathic gout, unspecified site: Secondary | ICD-10-CM | POA: Diagnosis not present

## 2018-09-04 DIAGNOSIS — D62 Acute posthemorrhagic anemia: Secondary | ICD-10-CM | POA: Diagnosis not present

## 2018-09-17 DIAGNOSIS — T8452XA Infection and inflammatory reaction due to internal left hip prosthesis, initial encounter: Secondary | ICD-10-CM | POA: Diagnosis not present

## 2018-09-17 DIAGNOSIS — D500 Iron deficiency anemia secondary to blood loss (chronic): Secondary | ICD-10-CM | POA: Diagnosis not present

## 2018-09-17 DIAGNOSIS — Z8719 Personal history of other diseases of the digestive system: Secondary | ICD-10-CM | POA: Diagnosis not present

## 2018-09-17 DIAGNOSIS — Z8619 Personal history of other infectious and parasitic diseases: Secondary | ICD-10-CM | POA: Diagnosis not present

## 2018-09-17 DIAGNOSIS — Y831 Surgical operation with implant of artificial internal device as the cause of abnormal reaction of the patient, or of later complication, without mention of misadventure at the time of the procedure: Secondary | ICD-10-CM | POA: Diagnosis not present

## 2018-10-05 DIAGNOSIS — M1611 Unilateral primary osteoarthritis, right hip: Secondary | ICD-10-CM | POA: Diagnosis not present

## 2018-11-04 DIAGNOSIS — M1611 Unilateral primary osteoarthritis, right hip: Secondary | ICD-10-CM | POA: Diagnosis not present

## 2018-11-27 DIAGNOSIS — M1000 Idiopathic gout, unspecified site: Secondary | ICD-10-CM | POA: Diagnosis not present

## 2018-12-10 DIAGNOSIS — K567 Ileus, unspecified: Secondary | ICD-10-CM | POA: Diagnosis not present

## 2018-12-10 DIAGNOSIS — I10 Essential (primary) hypertension: Secondary | ICD-10-CM | POA: Diagnosis not present

## 2018-12-10 DIAGNOSIS — R9431 Abnormal electrocardiogram [ECG] [EKG]: Secondary | ICD-10-CM | POA: Diagnosis not present

## 2018-12-10 DIAGNOSIS — T8459XD Infection and inflammatory reaction due to other internal joint prosthesis, subsequent encounter: Secondary | ICD-10-CM | POA: Diagnosis not present

## 2018-12-10 DIAGNOSIS — Z96649 Presence of unspecified artificial hip joint: Secondary | ICD-10-CM | POA: Diagnosis not present

## 2018-12-10 DIAGNOSIS — K9189 Other postprocedural complications and disorders of digestive system: Secondary | ICD-10-CM | POA: Diagnosis not present

## 2018-12-10 DIAGNOSIS — Z6841 Body Mass Index (BMI) 40.0 and over, adult: Secondary | ICD-10-CM | POA: Diagnosis not present

## 2018-12-10 DIAGNOSIS — Y831 Surgical operation with implant of artificial internal device as the cause of abnormal reaction of the patient, or of later complication, without mention of misadventure at the time of the procedure: Secondary | ICD-10-CM | POA: Diagnosis not present

## 2018-12-10 DIAGNOSIS — N189 Chronic kidney disease, unspecified: Secondary | ICD-10-CM | POA: Diagnosis not present

## 2018-12-10 DIAGNOSIS — A0472 Enterocolitis due to Clostridium difficile, not specified as recurrent: Secondary | ICD-10-CM | POA: Diagnosis not present

## 2018-12-10 DIAGNOSIS — M1000 Idiopathic gout, unspecified site: Secondary | ICD-10-CM | POA: Diagnosis not present

## 2019-01-04 DIAGNOSIS — Z20828 Contact with and (suspected) exposure to other viral communicable diseases: Secondary | ICD-10-CM | POA: Diagnosis not present

## 2019-01-04 DIAGNOSIS — Z03818 Encounter for observation for suspected exposure to other biological agents ruled out: Secondary | ICD-10-CM | POA: Diagnosis not present

## 2019-01-04 DIAGNOSIS — M1611 Unilateral primary osteoarthritis, right hip: Secondary | ICD-10-CM | POA: Diagnosis not present

## 2019-01-06 DIAGNOSIS — Z79899 Other long term (current) drug therapy: Secondary | ICD-10-CM | POA: Diagnosis not present

## 2019-01-06 DIAGNOSIS — G8918 Other acute postprocedural pain: Secondary | ICD-10-CM | POA: Diagnosis not present

## 2019-01-06 DIAGNOSIS — I129 Hypertensive chronic kidney disease with stage 1 through stage 4 chronic kidney disease, or unspecified chronic kidney disease: Secondary | ICD-10-CM | POA: Diagnosis not present

## 2019-01-06 DIAGNOSIS — Z8619 Personal history of other infectious and parasitic diseases: Secondary | ICD-10-CM | POA: Diagnosis not present

## 2019-01-06 DIAGNOSIS — Z96641 Presence of right artificial hip joint: Secondary | ICD-10-CM | POA: Diagnosis not present

## 2019-01-06 DIAGNOSIS — Z96643 Presence of artificial hip joint, bilateral: Secondary | ICD-10-CM | POA: Diagnosis not present

## 2019-01-06 DIAGNOSIS — Z89622 Acquired absence of left hip joint: Secondary | ICD-10-CM | POA: Diagnosis not present

## 2019-01-06 DIAGNOSIS — T8459XD Infection and inflammatory reaction due to other internal joint prosthesis, subsequent encounter: Secondary | ICD-10-CM | POA: Diagnosis not present

## 2019-01-06 DIAGNOSIS — Z4732 Aftercare following explantation of hip joint prosthesis: Secondary | ICD-10-CM | POA: Diagnosis not present

## 2019-01-06 DIAGNOSIS — N183 Chronic kidney disease, stage 3 unspecified: Secondary | ICD-10-CM | POA: Diagnosis not present

## 2019-01-06 DIAGNOSIS — Z6841 Body Mass Index (BMI) 40.0 and over, adult: Secondary | ICD-10-CM | POA: Diagnosis not present

## 2019-01-07 DIAGNOSIS — Z6841 Body Mass Index (BMI) 40.0 and over, adult: Secondary | ICD-10-CM | POA: Diagnosis not present

## 2019-01-07 DIAGNOSIS — Z4732 Aftercare following explantation of hip joint prosthesis: Secondary | ICD-10-CM | POA: Diagnosis not present

## 2019-01-07 DIAGNOSIS — Z96641 Presence of right artificial hip joint: Secondary | ICD-10-CM | POA: Diagnosis not present

## 2019-01-07 DIAGNOSIS — N183 Chronic kidney disease, stage 3 unspecified: Secondary | ICD-10-CM | POA: Diagnosis not present

## 2019-01-07 DIAGNOSIS — Z79899 Other long term (current) drug therapy: Secondary | ICD-10-CM | POA: Diagnosis not present

## 2019-01-07 DIAGNOSIS — Z8619 Personal history of other infectious and parasitic diseases: Secondary | ICD-10-CM | POA: Diagnosis not present

## 2019-01-07 DIAGNOSIS — I129 Hypertensive chronic kidney disease with stage 1 through stage 4 chronic kidney disease, or unspecified chronic kidney disease: Secondary | ICD-10-CM | POA: Diagnosis not present

## 2019-01-21 DIAGNOSIS — Z96649 Presence of unspecified artificial hip joint: Secondary | ICD-10-CM | POA: Diagnosis not present

## 2019-01-21 DIAGNOSIS — T8484XA Pain due to internal orthopedic prosthetic devices, implants and grafts, initial encounter: Secondary | ICD-10-CM | POA: Diagnosis not present

## 2019-01-28 DIAGNOSIS — Z8739 Personal history of other diseases of the musculoskeletal system and connective tissue: Secondary | ICD-10-CM | POA: Diagnosis not present

## 2019-01-28 DIAGNOSIS — R7303 Prediabetes: Secondary | ICD-10-CM | POA: Diagnosis not present

## 2019-01-28 DIAGNOSIS — I10 Essential (primary) hypertension: Secondary | ICD-10-CM | POA: Diagnosis not present

## 2019-02-04 DIAGNOSIS — M1611 Unilateral primary osteoarthritis, right hip: Secondary | ICD-10-CM | POA: Diagnosis not present

## 2019-03-07 DIAGNOSIS — M1611 Unilateral primary osteoarthritis, right hip: Secondary | ICD-10-CM | POA: Diagnosis not present

## 2019-04-04 DIAGNOSIS — M1611 Unilateral primary osteoarthritis, right hip: Secondary | ICD-10-CM | POA: Diagnosis not present

## 2019-05-10 ENCOUNTER — Ambulatory Visit: Payer: Self-pay | Attending: Internal Medicine

## 2019-05-10 DIAGNOSIS — Z23 Encounter for immunization: Secondary | ICD-10-CM

## 2019-05-10 NOTE — Progress Notes (Signed)
   Covid-19 Vaccination Clinic  Name:  Roy [name]    MRN: [id number] DOB: [birth year]  05/10/2019  [name] was observed post Covid-19 immunization for 15 minutes without incident. He was provided with Vaccine Information Sheet and instruction to access the V-Safe system.   [name] was instructed to call 911 with any severe reactions post vaccine: Marland Kitchen Difficulty breathing  . Swelling of face and throat  . A fast heartbeat  . A bad rash all over body  . Dizziness and weakness   Immunizations Administered    Name Date Dose VIS Date Route   Moderna COVID-19 Vaccine 05/10/2019 12:26 PM 0.5 mL 12/31/2018 Intramuscular   Manufacturer: Moderna   Lot: [id number]   NDC: 81448-185-63

## 2019-06-07 ENCOUNTER — Ambulatory Visit: Payer: Self-pay | Attending: Internal Medicine

## 2019-06-07 DIAGNOSIS — Z23 Encounter for immunization: Secondary | ICD-10-CM

## 2019-06-07 NOTE — Progress Notes (Signed)
   Covid-19 Vaccination Clinic  Name:  Roy [name]    MRN: [id number] DOB: 09-14-1960  06/07/2019  Mr. Shevchenko was observed post Covid-19 immunization for 15 minutes without incident. He was provided with Vaccine Information Sheet and instruction to access the V-Safe system.   Mr. Hunnell was instructed to call 911 with any severe reactions post vaccine: Marland Kitchen Difficulty breathing  . Swelling of face and throat  . A fast heartbeat  . A bad rash all over body  . Dizziness and weakness   Immunizations Administered    Name Date Dose VIS Date Route   Moderna COVID-19 Vaccine 06/07/2019 11:31 AM 0.5 mL 12/2018 Intramuscular   Manufacturer: Moderna   Lot: [id number]   NDC: 19471-252-71

## 2019-06-19 IMAGING — US VENOUS DOPPLER ULTRASOUND OF LEFT LOWER EXTREMITY
1 series · 13 of 24 positions shown · non-contrast
Comparison: None.

CLINICAL DATA: Left hip pain.  Recent surgery.



[Series 1: venous doppler ultrasound of left lower extremity · 0.09mm/px · 13 of 50 slices shown]
[im 1/50]
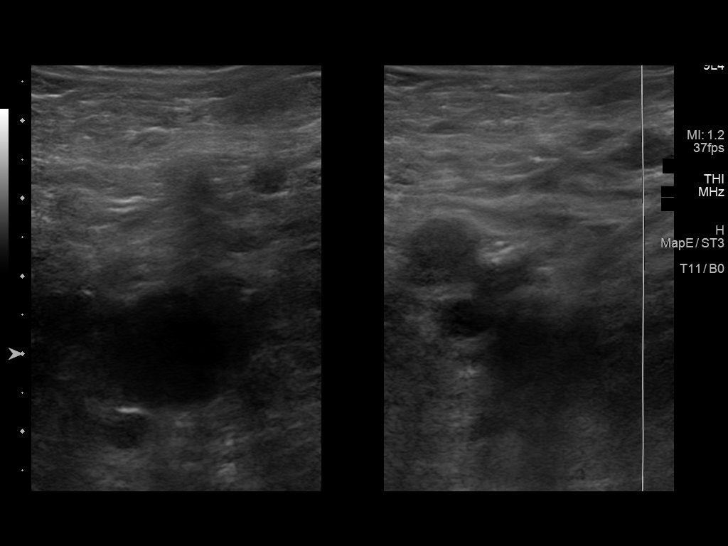
[im 5/50]
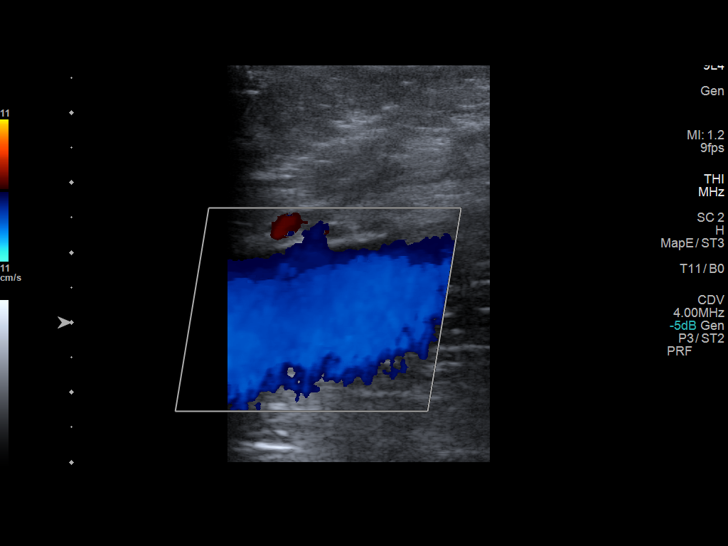
[im 9/50]
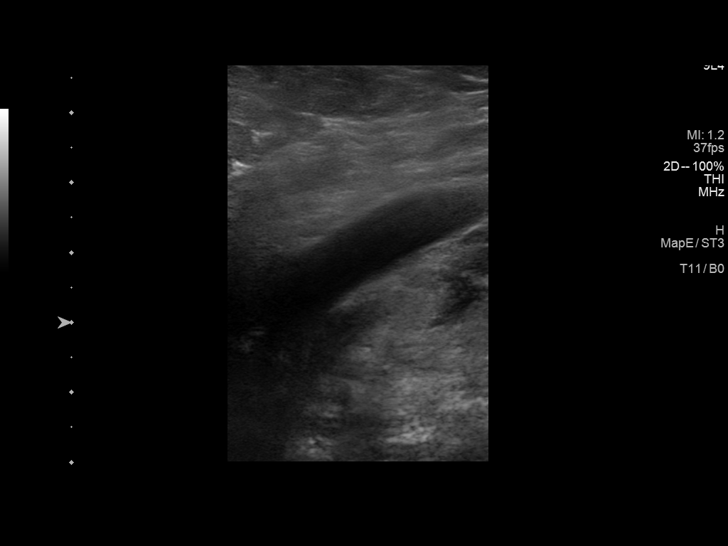
[im 13/50]
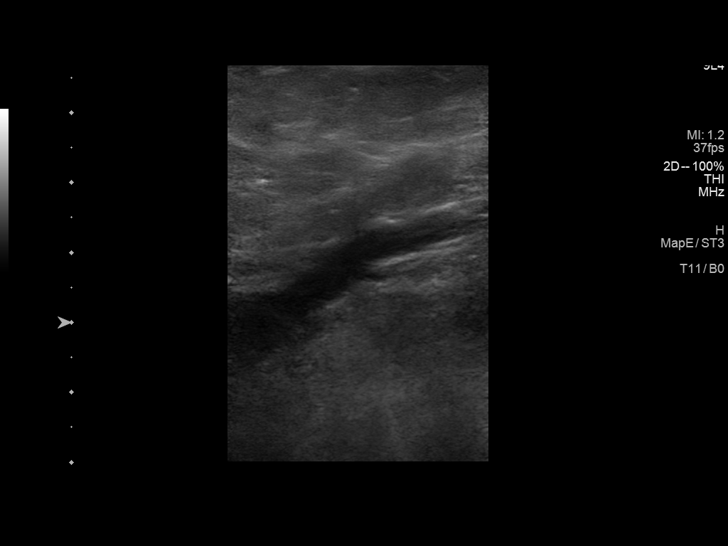
[im 18/50]
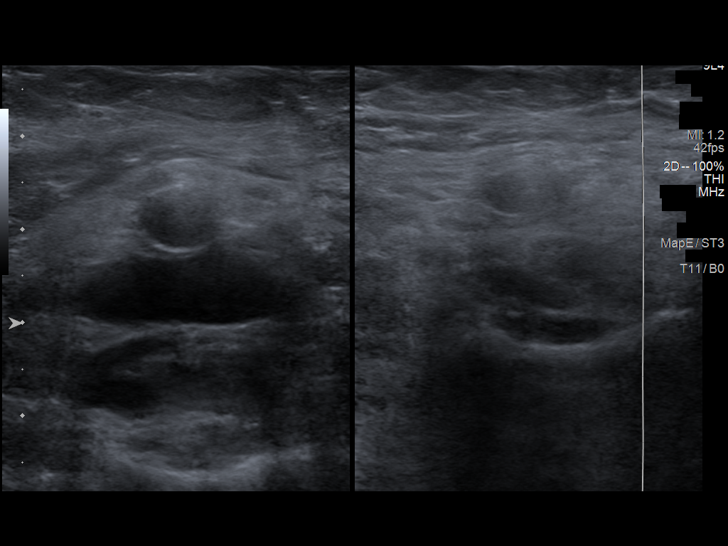
[im 22/50]
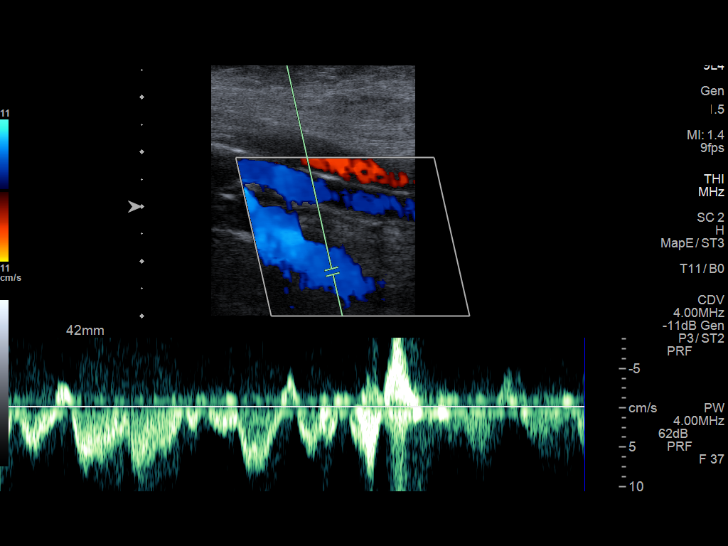
[im 26/50]
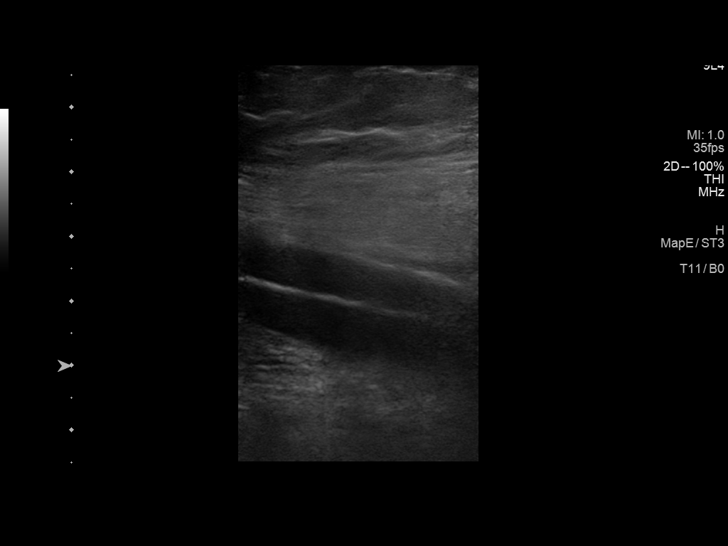
[im 28/50]
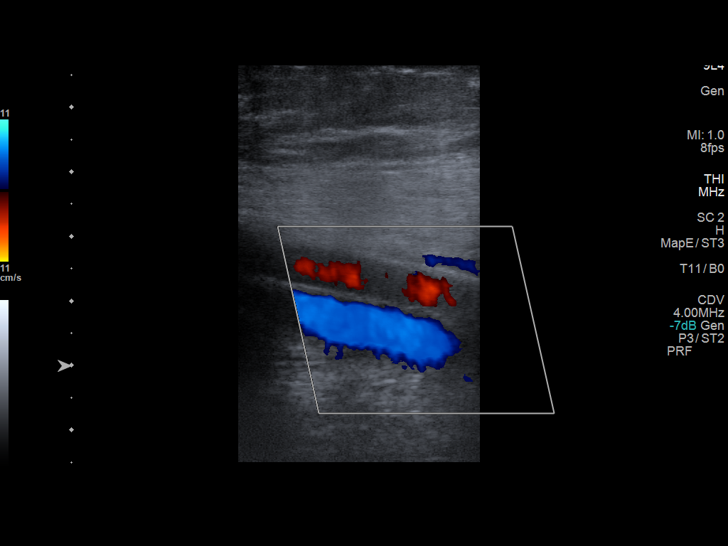
[im 32/50]
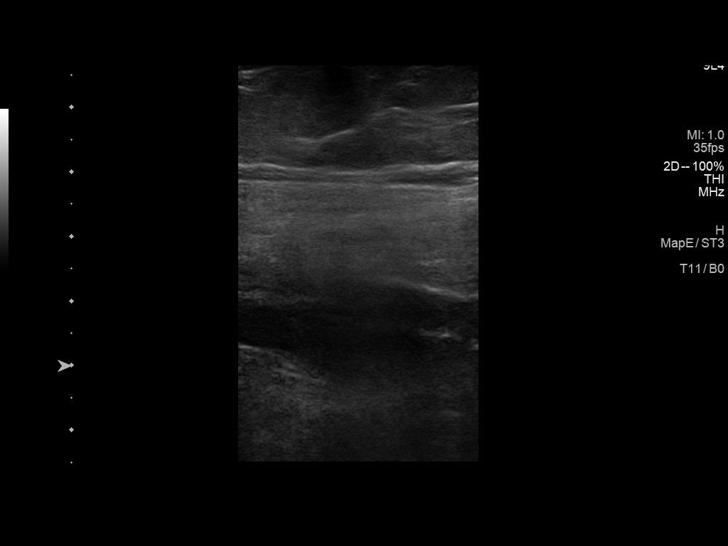
[im 37/50]
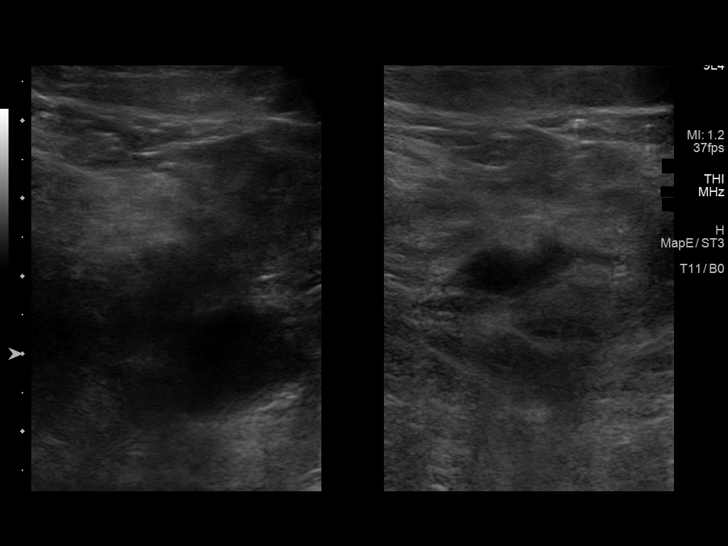
[im 41/50]
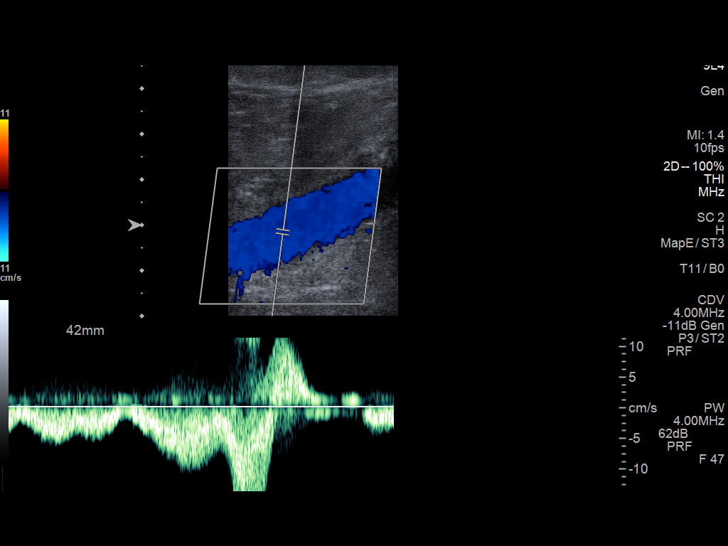
[im 45/50]
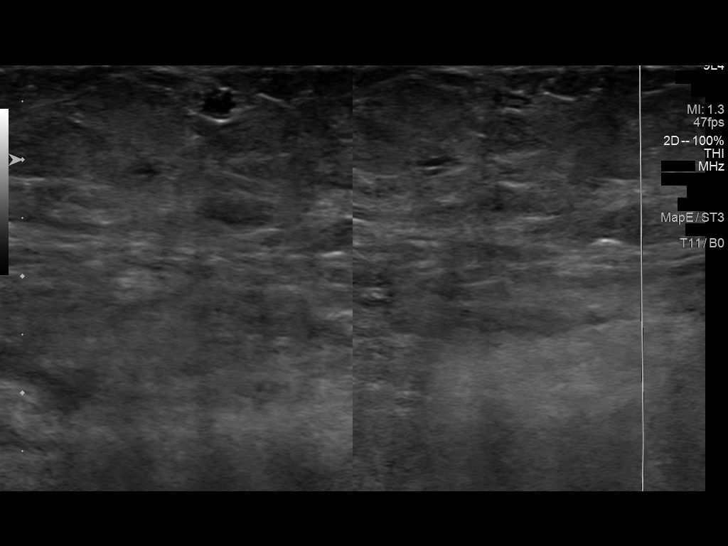
[im 50/50]
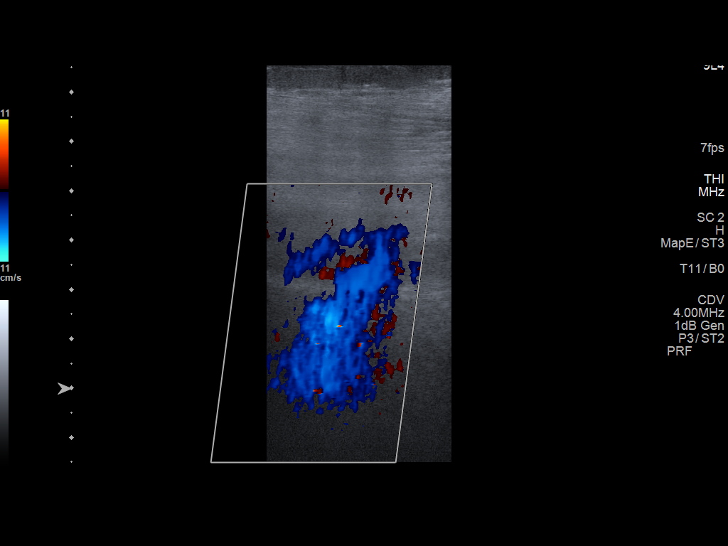

[13 of 24 positions shown; findings below may reference images not displayed]

FINDINGS: Contralateral Common Femoral Vein: Respiratory phasicity is normal
and symmetric with the symptomatic side. No evidence of thrombus.
Normal compressibility.

Common Femoral Vein: No evidence of thrombus. Normal
compressibility, respiratory phasicity and response to augmentation.

Saphenofemoral Junction: No evidence of thrombus. Normal
compressibility and flow on color Doppler imaging.

Profunda Femoral Vein: No evidence of thrombus. Normal
compressibility and flow on color Doppler imaging.

Femoral Vein: No evidence of thrombus. Normal compressibility,
respiratory phasicity and response to augmentation.

Popliteal Vein: No evidence of thrombus. Normal compressibility,
respiratory phasicity and response to augmentation.

Calf Veins: No evidence of thrombus. Normal compressibility and flow
on color Doppler imaging.

Superficial Great Saphenous Vein: No evidence of thrombus. Normal
compressibility.

Other Findings:  None.
IMPRESSION: No evidence of deep venous thrombosis.

## 2019-06-19 IMAGING — DX DG HIP (WITH OR WITHOUT PELVIS) 2-3V LEFT
3 series · 3 of 3 positions shown · non-contrast
Comparison: None.

CLINICAL DATA: Left hip pain. Recent left hip surgery with
infection.

EXAM:
DG HIP (WITH OR WITHOUT PELVIS) 2-3V LEFT

[pelvis ap]
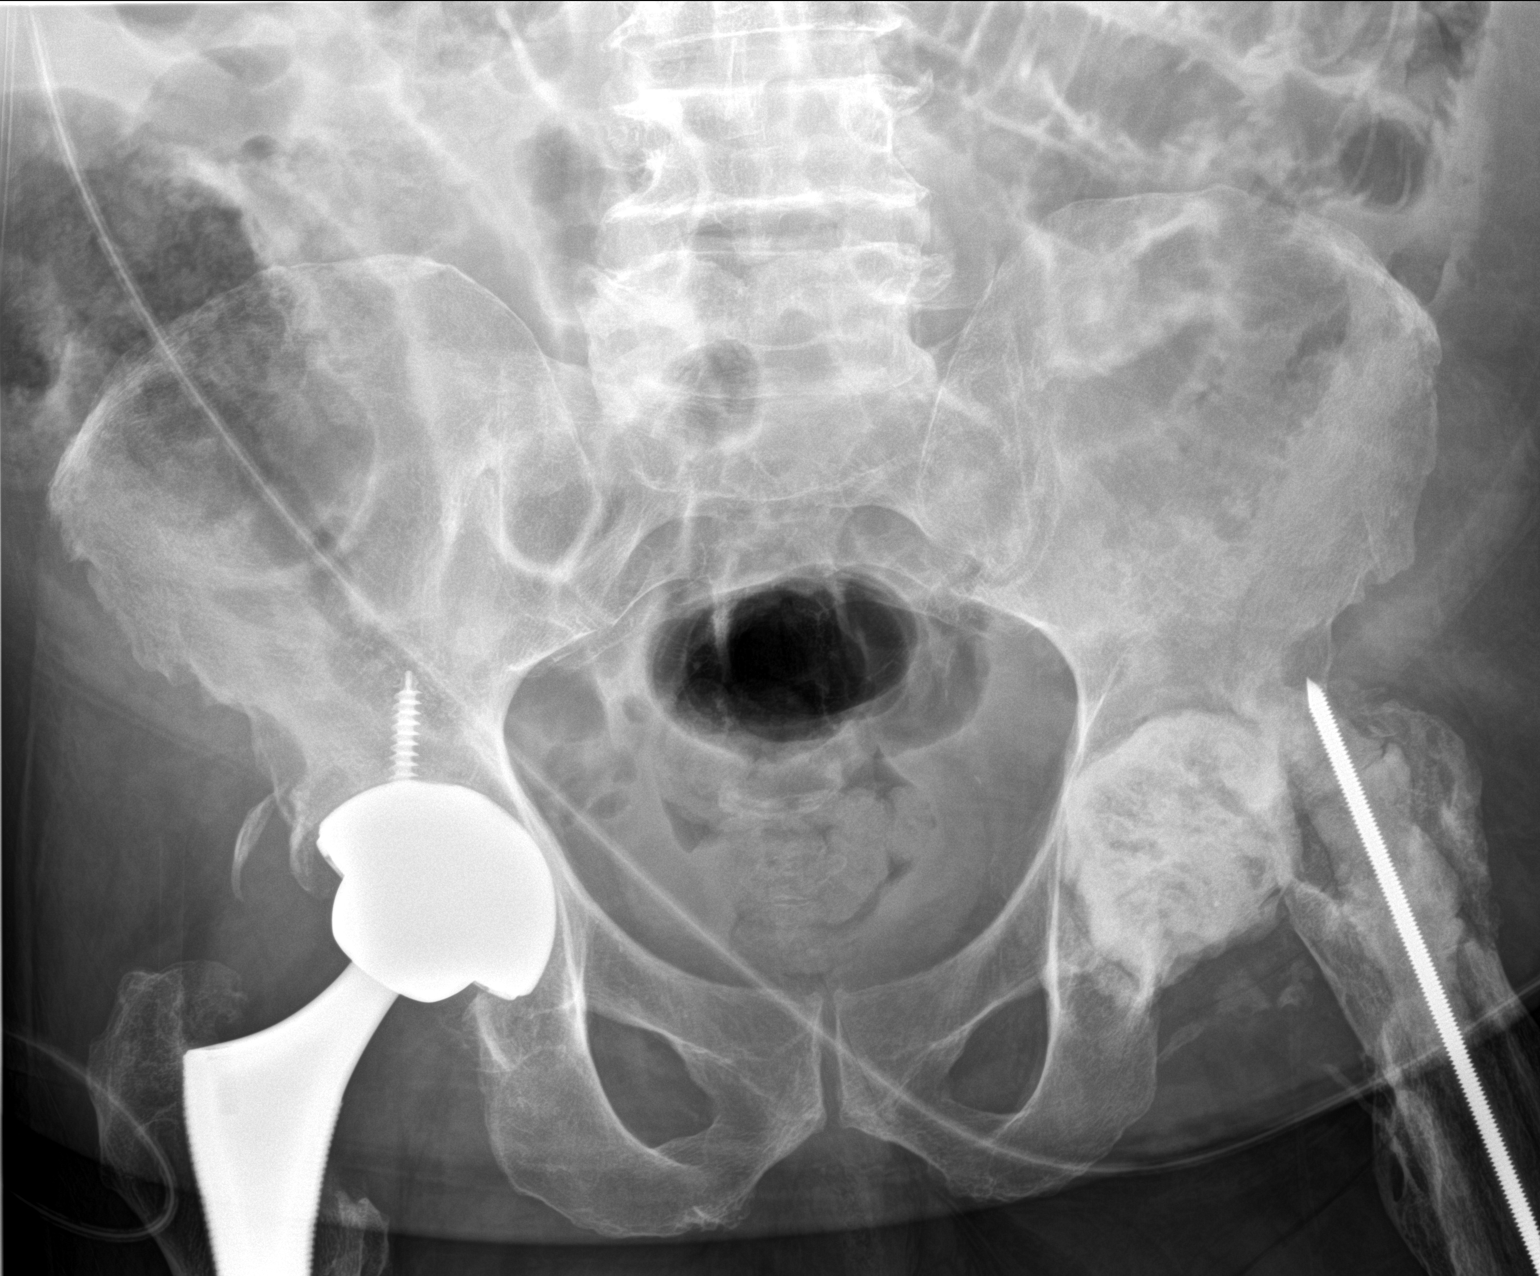

[hip ap]
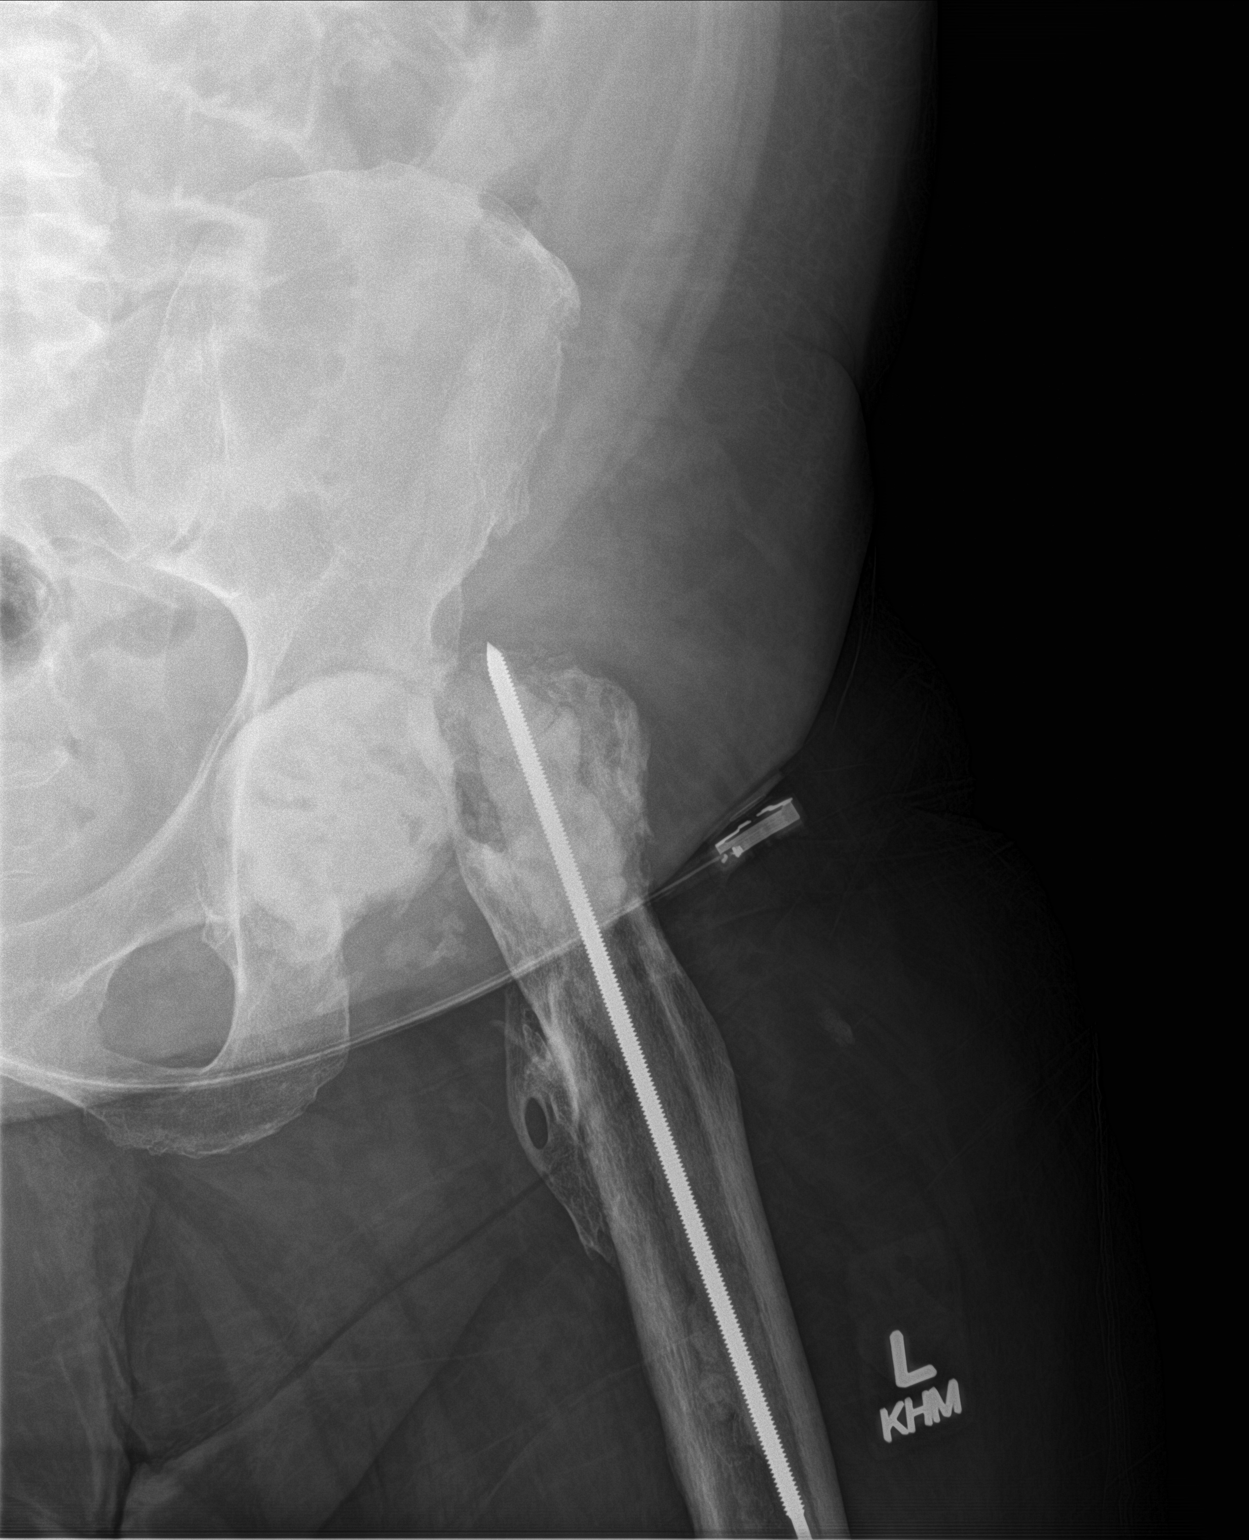

[hip lat]
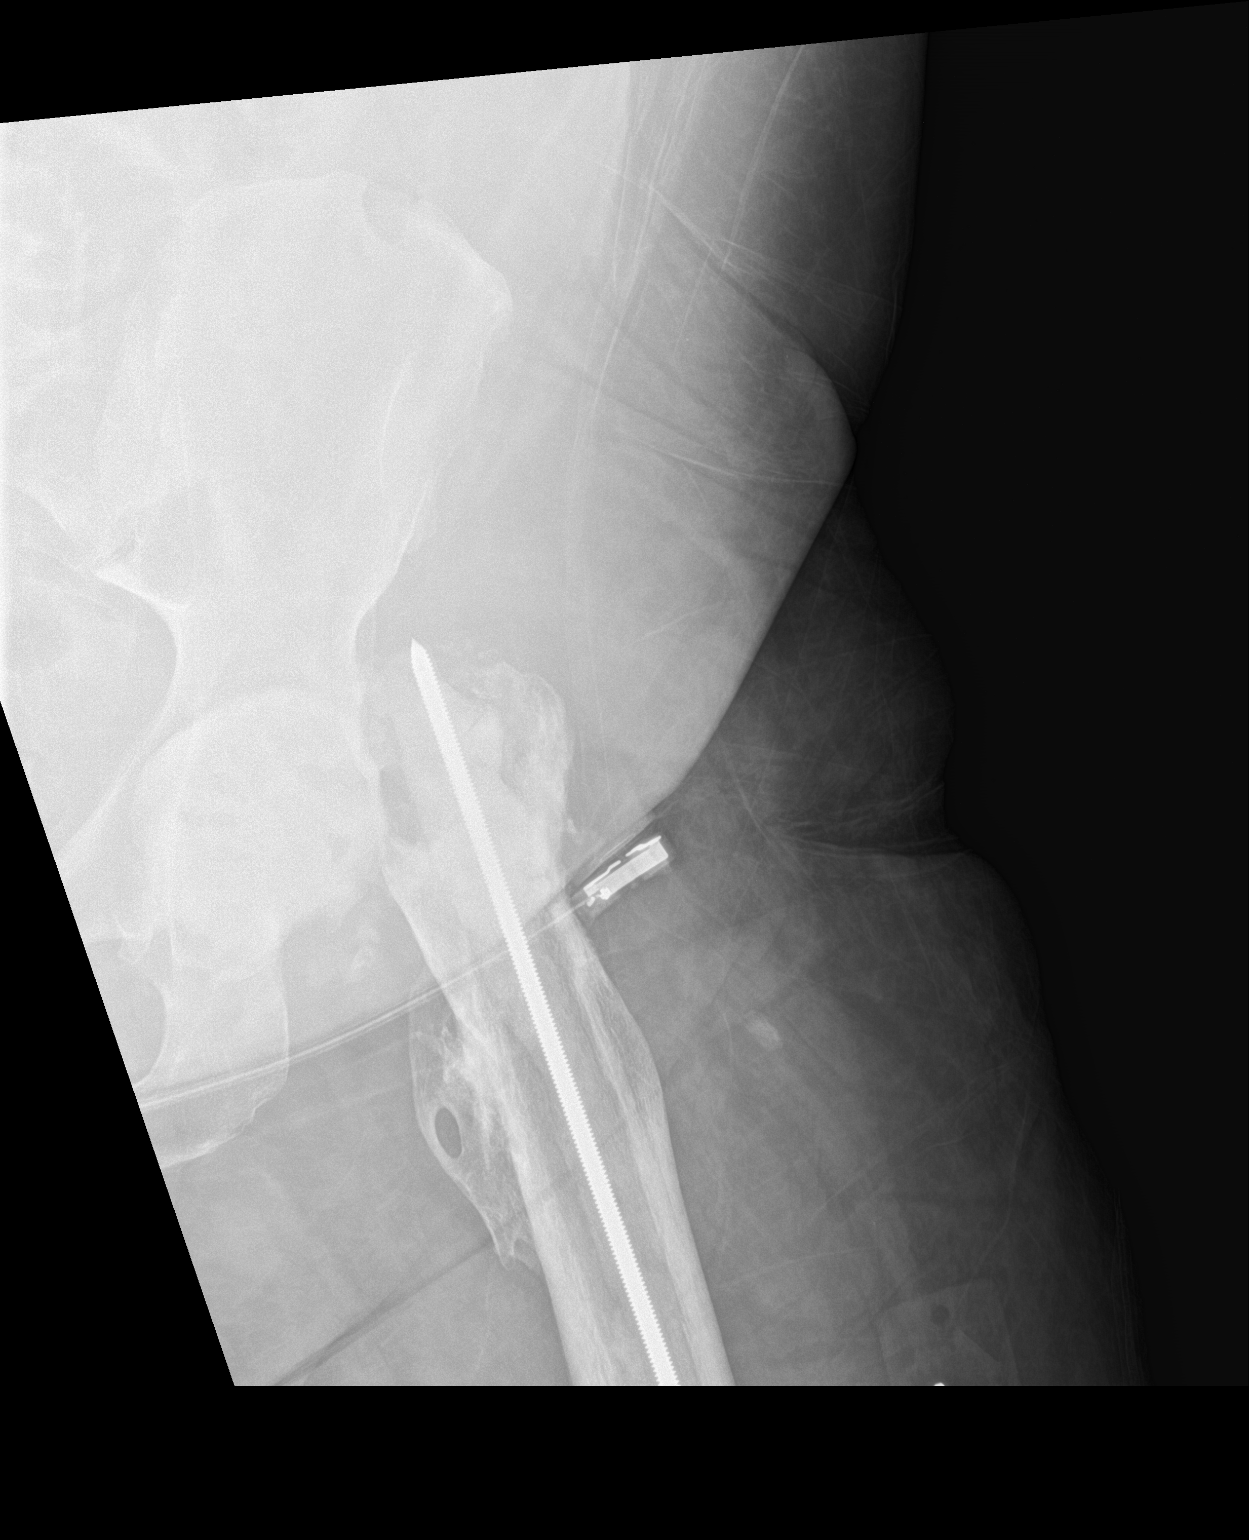

[3 of 3 positions shown; findings below may reference images not displayed]

FINDINGS: Right hip replacement in satisfactory position alignment

Resection of left femoral head. Cement is present within the region
of the femoral head. There is a fracture deformity of the femoral
neck with lateral displacement of the femur. There is cement within
the proximal femur as well as a threaded rod. There is chronic
periosteal new bone formation medial to the proximal femur. These
findings are likely chronic. Unfortunately no prior studies
available for comparison.
IMPRESSION: Displaced fracture proximal left femur with resection of the femoral
head. Findings are likely chronic however no prior studies available
for comparison.

## 2019-06-19 IMAGING — DX LEFT KNEE - 1-2 VIEW
3 series · 3 of 3 positions shown · non-contrast
Comparison: None.

CLINICAL DATA: Left knee pain.  History of recent left hip surgery.

EXAM:
LEFT KNEE - 1-2 VIEW

[knee ap]
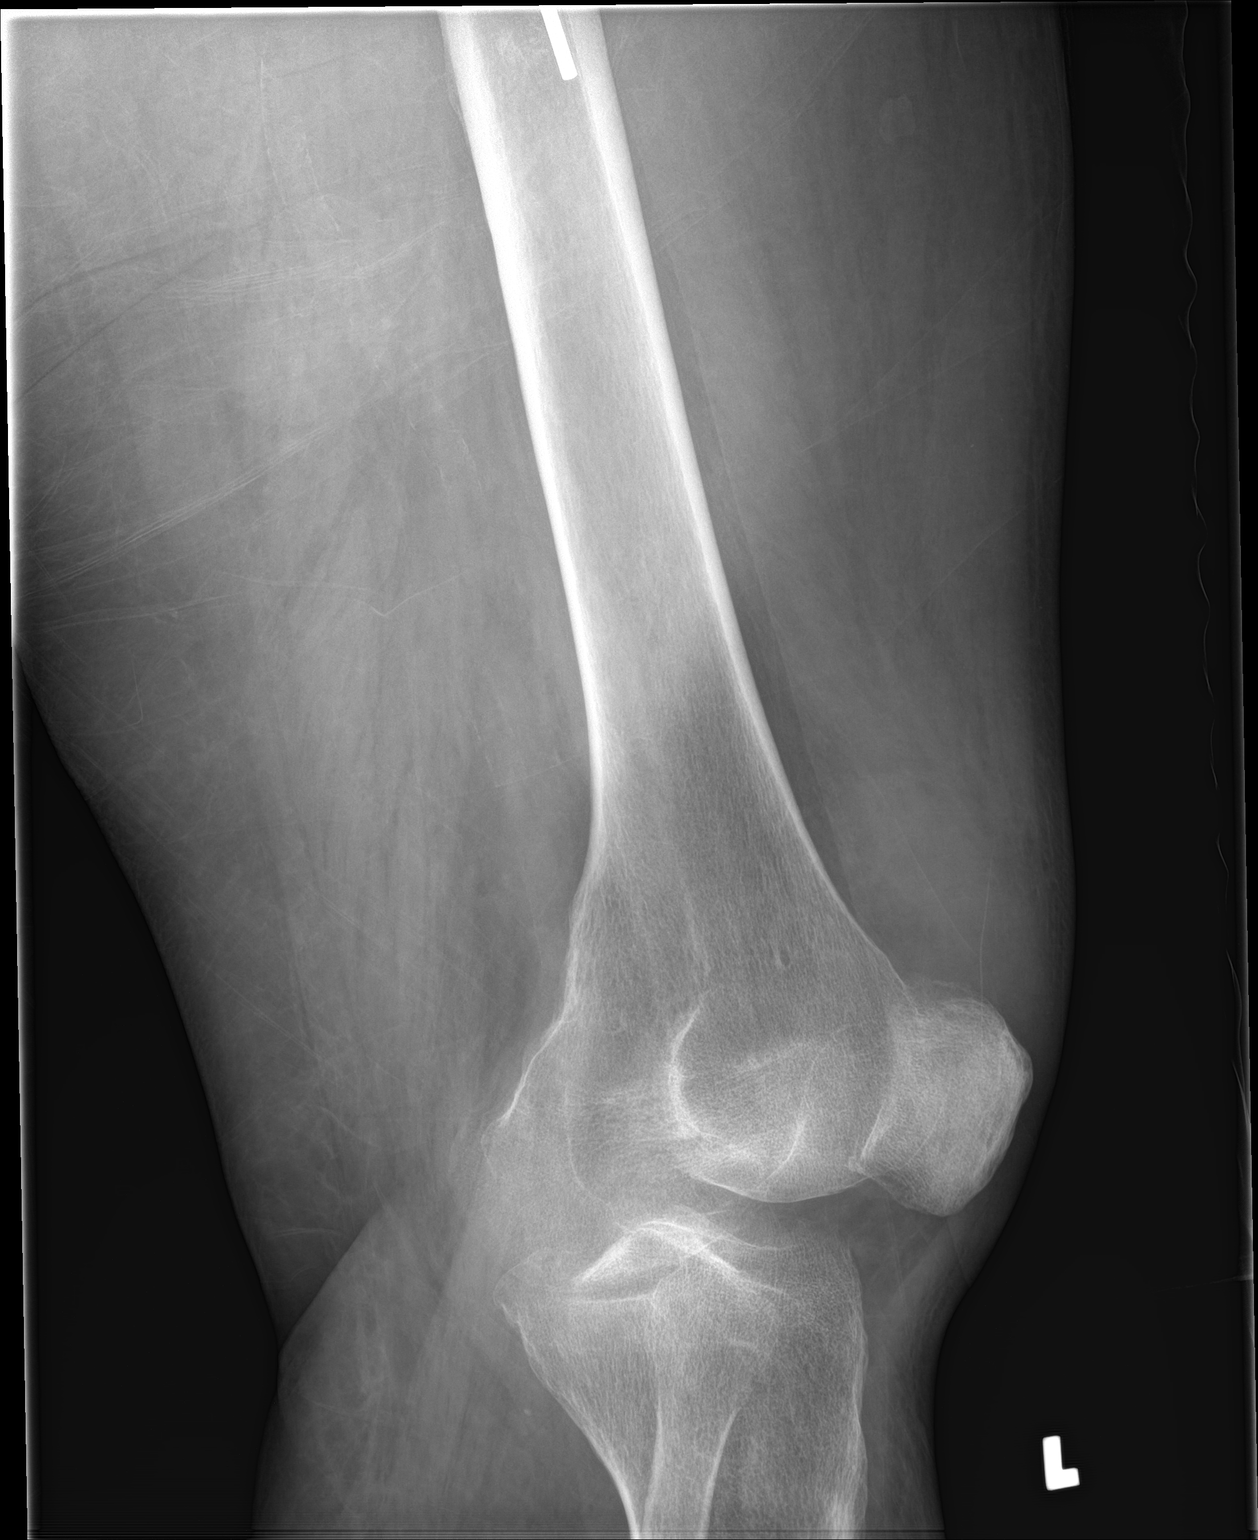

[knee lat (1 of 2)]
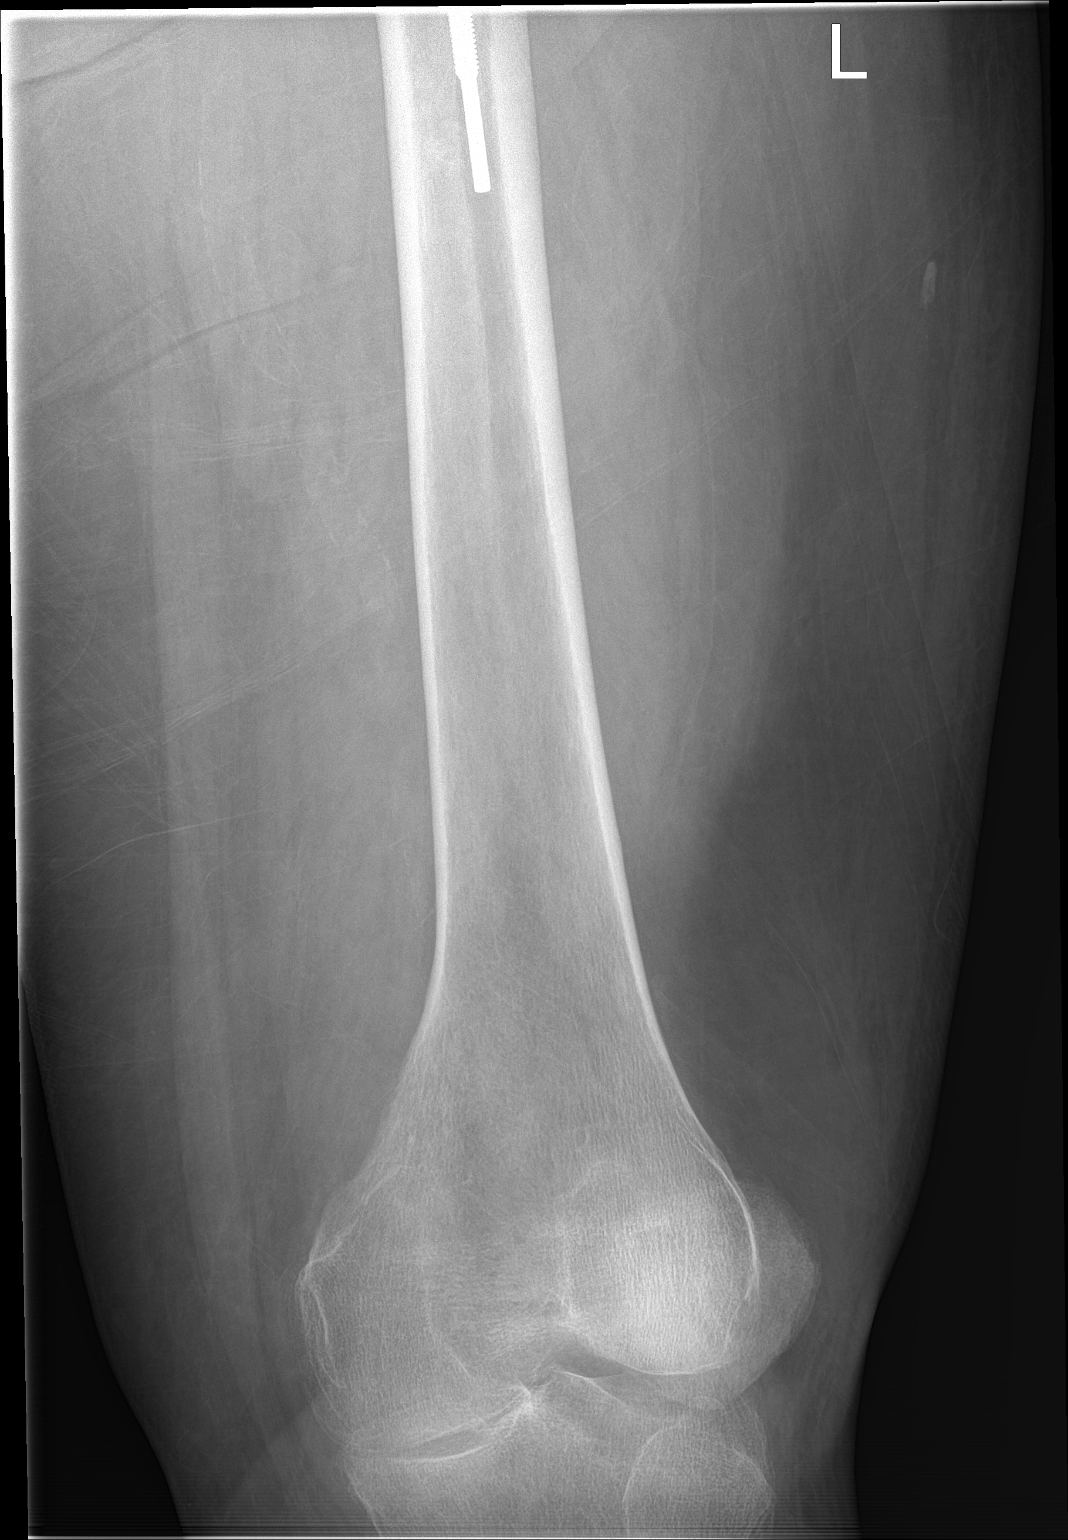

[knee lat (2 of 2)]
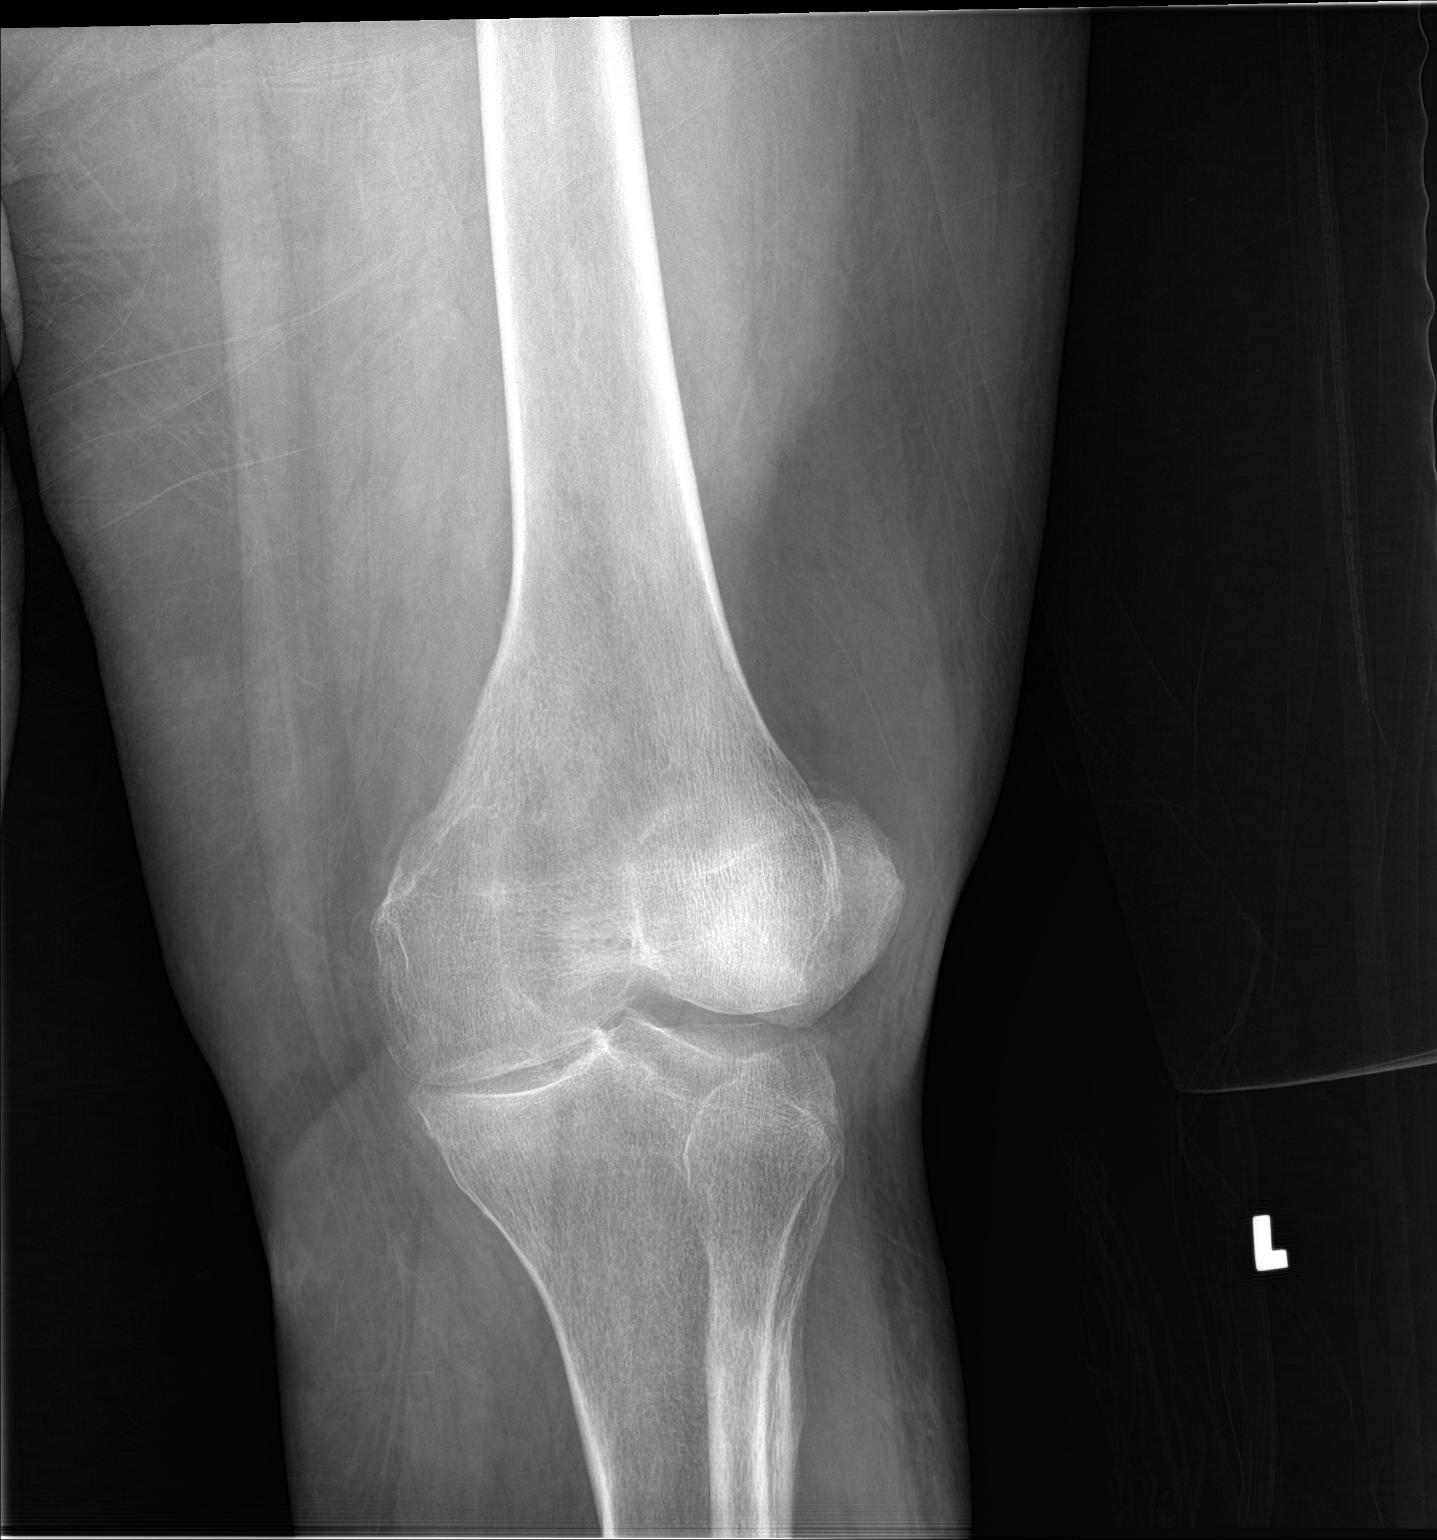

[3 of 3 positions shown; findings below may reference images not displayed]

FINDINGS: Examination is limited due to poor positioning and no true lateral
film.

Moderate knee joint degenerative changes are noted.

No acute bony findings or destructive bony changes.

Suspect suprapatellar knee joint effusion.
IMPRESSION: 1. Limited examination due to poor positioning.
2. Knee joint degenerative changes but no acute bony findings.
3. Suspect suprapatellar knee joint effusion.

## 2020-11-22 ENCOUNTER — Other Ambulatory Visit: Payer: Self-pay

## 2020-11-22 ENCOUNTER — Ambulatory Visit
Admission: EM | Admit: 2020-11-22 | Discharge: 2020-11-22 | Disposition: A | Discharge status: Home / Self Care | Payer: 59 | Attending: Urgent Care | Admitting: Urgent Care

## 2020-11-22 ENCOUNTER — Encounter: Payer: Self-pay | Admitting: Emergency Medicine

## 2020-11-22 DIAGNOSIS — K921 Melena: Secondary | ICD-10-CM | POA: Diagnosis not present

## 2020-11-22 NOTE — ED Provider Notes (Signed)
[facility]   MRN: [id number] DOB: 01-06-1961  Subjective:   Roy [name] is a 60 y.o. male presenting for 2 to 3-day history of bloody stools.  Patient states that initially it was a lot more prominent but has eased up.  Denies fever, nausea, vomiting, abdominal pain.  No history of GI issues.  Has never had a colonoscopy.  Does not feel any kind of perianal pain.  No masses.  Does not have any trouble with constipation.  Last bowel movement was last night and was completely normal apart from the bleeding.  No current facility-administered medications for this encounter.  Current Outpatient Medications:    amLODipine (NORVASC) 10 MG tablet, Take 1 tablet (10 mg total) by mouth daily. (Patient taking differently: Take 10 mg by mouth every morning. ), Disp: 30 tablet, Rfl: 0   enoxaparin (LOVENOX) 40 MG/0.4ML injection, Inject 40 mg into the skin daily. 13 days starting on 07/03/2018, Disp: , Rfl:    Liniments (SALONPAS PAIN RELIEF PATCH EX), Apply 1 Package topically daily as needed (for pain)., Disp: , Rfl:    Nebivolol HCl (BYSTOLIC) 20 MG TABS, Take 20 mg by mouth daily., Disp: , Rfl:    pantoprazole (PROTONIX) 40 MG tablet, Take 40 mg by mouth daily. , Disp: , Rfl:    sacubitril-valsartan (ENTRESTO) 97-103 MG, Take 1 tablet by mouth 2 (two) times daily., Disp: , Rfl:    spironolactone (ALDACTONE) 25 MG tablet, Take 25 mg by mouth daily., Disp: , Rfl:    traMADol (ULTRAM) 50 MG tablet, Take 25-50 mg by mouth every 4 (four) hours as needed for moderate pain or severe pain. , Disp: , Rfl:    No Known Allergies  Past Medical History:  Diagnosis Date   Abscess 02/27/2017   LEFT THIGH   Hypertension      Past Surgical History:  Procedure Laterality Date   IRRIGATION AND DEBRIDEMENT ABSCESS Left 02/28/2017   Procedure: IRRIGATION AND DEBRIDEMENT ABSCESS/THIGH;  Surgeon: Kinsinger, De Blanch, MD;  Location: [facility] OR;  Service: General;  Laterality: Left;   JOINT  REPLACEMENT      History reviewed. No pertinent family history.  Social History   Tobacco Use   Smoking status: Never   Smokeless tobacco: Never  Vaping Use   Vaping Use: Never used  Substance Use Topics   Alcohol use: Yes    Comment: OCCASIONAL       Drug use: No    ROS   Objective:   Vitals: BP (!) 162/95   Pulse 90   Temp 98.7 F (37.1 C)   Resp 18   SpO2 98%   Physical Exam Constitutional:      General: He is not in acute distress.    Appearance: Normal appearance. He is well-developed and normal weight. He is not ill-appearing, toxic-appearing or diaphoretic.  HENT:     Head: Normocephalic and atraumatic.     Right Ear: External ear normal.     Left Ear: External ear normal.     Nose: Nose normal.     Mouth/Throat:     Pharynx: Oropharynx is clear.  Eyes:     General: No scleral icterus.       Right eye: No discharge.        Left eye: No discharge.     Extraocular Movements: Extraocular movements intact.     Pupils: Pupils are equal, round, and reactive to light.  Cardiovascular:     Rate and Rhythm: Normal rate.  Pulmonary:     Effort: Pulmonary effort is normal.  Genitourinary:    Rectum: No tenderness, anal fissure or external hemorrhoid.  Musculoskeletal:     Cervical back: Normal range of motion.  Neurological:     Mental Status: He is alert and oriented to person, place, and time.  Psychiatric:        Mood and Affect: Mood normal.        Behavior: Behavior normal.        Thought Content: Thought content normal.        Judgment: Judgment normal.    Assessment and Plan :   PDMP not reviewed this encounter.  1. Bloody stools    Patient does not endorse any chest pain, shortness of breath, heart racing, pallor.  CBC is pending.  High suspicion for gastrointestinal bleeding.  Patient needs a consultation with a gastroenterologist.  Referral placed. Counseled patient on potential for adverse effects with medications prescribed/recommended  today, ER and return-to-clinic precautions discussed, patient verbalized understanding.     Wallis Bamberg, PA-C 11/22/20 [id number]

## 2020-11-22 NOTE — ED Triage Notes (Addendum)
Pt is present today with with rectal bleeding.Pt states that he has been constipated lately and that after he would try to use the restroom the bleeding would start. Pt denies any constipation right now but still bleeding.

## 2020-11-23 LAB — CBC
Hematocrit: 35 % — ABNORMAL LOW (ref 37.5–51.0)
Hemoglobin: 11.5 g/dL — ABNORMAL LOW (ref 13.0–17.7)
MCH: 30.3 pg (ref 26.6–33.0)
MCHC: 32.9 g/dL (ref 31.5–35.7)
MCV: 92 fL (ref 79–97)
Platelets: 194 10*3/uL (ref 150–450)
RBC: 3.79 x10E6/uL — ABNORMAL LOW (ref 4.14–5.80)
RDW: 12.8 % (ref 11.6–15.4)
WBC: 7.8 10*3/uL (ref 3.4–10.8)

## 2021-04-29 ENCOUNTER — Ambulatory Visit (INDEPENDENT_AMBULATORY_CARE_PROVIDER_SITE_OTHER): Payer: 59 | Admitting: Gastroenterology

## 2021-04-29 ENCOUNTER — Encounter: Payer: Self-pay | Admitting: Gastroenterology

## 2021-04-29 VITALS — BP 144/88 | HR 107 | Ht 73.0 in | Wt 315.0 lb

## 2021-04-29 DIAGNOSIS — Z1212 Encounter for screening for malignant neoplasm of rectum: Secondary | ICD-10-CM | POA: Diagnosis not present

## 2021-04-29 DIAGNOSIS — Z1211 Encounter for screening for malignant neoplasm of colon: Secondary | ICD-10-CM

## 2021-04-29 DIAGNOSIS — K921 Melena: Secondary | ICD-10-CM

## 2021-04-29 MED ORDER — SUTAB 1479-225-188 MG PO TABS: 1.0000 | ORAL_TABLET | Freq: Once | ORAL | 0 refills | Status: AC

## 2021-04-29 NOTE — Patient Instructions (Signed)
If you are age 61 or older, your body mass index should be between 23-30. Your Body mass index is 41.56 kg/m?Marland Kitchen If this is out of the aforementioned range listed, please consider follow up with your Primary Care Provider. ? ?If you are age 33 or younger, your body mass index should be between 19-25. Your Body mass index is 41.56 kg/m?Marland Kitchen If this is out of the aformentioned range listed, please consider follow up with your Primary Care Provider.  ? ?You have been scheduled for a colonoscopy. Please follow written instructions given to you at your visit today.  ?Please pick up your prep supplies at the pharmacy within the next 1-3 days. ?If you use inhalers (even only as needed), please bring them with you on the day of your procedure. ? ?The [facility] providers would like to encourage you to use [facility] to communicate with providers for non-urgent requests or questions.  Due to long hold times on the telephone, sending your provider a message by [facility] may be a faster and more efficient way to get a response.  Please allow 48 business hours for a response.  Please remember that this is for non-urgent requests.  ? ?It was a pleasure to see you today! ? ?Thank you for trusting me with your gastrointestinal care!   ? ?Scott E.Tomasa Rand, MD  ?

## 2021-04-29 NOTE — Progress Notes (Signed)
? ?HPI : Roy [name] is a very pleasant 61 year old male with history of obesity and hypertension who is referred to [place] by Dr.Veita Parke Simmers after an episode of hematochezia in October.  Patient states that in late October he woke from sleep with abdominal pain and urge to defecate.  He states that he passed lots of blood with clots, as well as some stool.  He had several more episodes of passing dark red blood with stool and presented to the emergency department. ?In the ED, his vitals and labs were unremarkable he was discharged home with recommendations to follow-up with GI.  A CT of the abdomen was not performed.  He denies any fevers, chills.  He is not having diarrhea.  No nausea or vomiting. ?He has not had any recurrence of these symptoms since then.  He denies any previous episodes similar to that. ?He denies any chronic GI symptoms to include abdominal pain, constipation, diarrhea or blood in the stool. ?He does recall seeing scant amount of bright red blood on the toilet paper in the days following his surgery when he was on pain medications ? ?The patient denies feeling unwell prior to the onset of his abdominal pain and hematochezia in October.  He denies any activities such as excessive exercise, outdoor activities or dehydration. ? ?He has never had a colonoscopy.  No family history of colon cancer. ? ?Past Medical History:  ?Diagnosis Date  ? Abscess 02/27/2017  ? LEFT THIGH  ? Hypertension   ? ? ? ?Past Surgical History:  ?Procedure Laterality Date  ? CATARACT EXTRACTION Right   ? ELBOW SURGERY    ? HIP SURGERY Bilateral   ? replacement  ? IRRIGATION AND DEBRIDEMENT ABSCESS Left 02/28/2017  ? Procedure: IRRIGATION AND DEBRIDEMENT ABSCESS/THIGH;  Surgeon: Kinsinger, De Blanch, MD;  Location: [facility] OR;  Service: General;  Laterality: Left;  ? JOINT REPLACEMENT    ? ?Family History  ?Problem Relation Age of Onset  ? Diabetes Father   ? Prostate cancer Paternal Uncle   ? Colon polyps Paternal Uncle   ?  Colon cancer Neg Hx   ? Stomach cancer Neg Hx   ? Esophageal cancer Neg Hx   ? Pancreatic cancer Neg Hx   ? ?Social History  ? ?Tobacco Use  ? Smoking status: Never  ? Smokeless tobacco: Never  ?Vaping Use  ? Vaping Use: Never used  ?Substance Use Topics  ? Alcohol use: Yes  ?  Comment: OCCASIONAL      ? Drug use: No  ? ?Current Outpatient Medications  ?Medication Sig Dispense Refill  ? amLODipine (NORVASC) 10 MG tablet Take 1 tablet (10 mg total) by mouth daily. (Patient taking differently: Take 10 mg by mouth every morning.) 30 tablet 0  ? aspirin EC 81 MG tablet Take 81 mg by mouth daily. Swallow whole.    ? carvedilol (COREG) 12.5 MG tablet Take 12.5 mg by mouth 2 (two) times daily with a meal.    ? Multiple Vitamin (MULTIVITAMIN ADULT PO) Take by mouth.    ? Omega-3 Fatty Acids (FISH OIL PO) Take by mouth.    ? pantoprazole (PROTONIX) 40 MG tablet Take 40 mg by mouth daily.     ? ?No current facility-administered medications for this visit.  ? ?No Known Allergies ? ? ?Review of Systems: ?All systems reviewed and negative except where noted in HPI.  ? ? ?No results found. ? ?Physical Exam: ?BP (!) 144/88   Pulse (!) 107  Ht 6\' 1"  (1.854 m)   Wt (!) 315 lb (142.9 kg)   SpO2 97%   BMI 41.56 kg/m?  ?Constitutional: Pleasant,well-developed, African American male in no acute distress. ?HEENT: Normocephalic and atraumatic. Conjunctivae are normal. No scleral icterus. ?Neck supple.  ?Cardiovascular: Normal rate, regular rhythm.  ?Pulmonary/chest: Effort normal and breath sounds normal. No wheezing, rales or rhonchi. ?Abdominal: Soft, nondistended, nontender. Bowel sounds active throughout. There are no masses palpable. No hepatomegaly. ?Extremities: no edema ?Neurological: Alert and oriented to person place and time. ?Skin: Skin is warm and dry. No rashes noted. ?Psychiatric: Normal mood and affect. Behavior is normal. ? ?CBC ?   ?Component Value Date/Time  ? WBC 7.8 11/22/2020 1421  ? WBC 12.4 (H) 07/15/2018  1557  ? RBC 3.79 (L) 11/22/2020 1421  ? RBC 3.50 (L) 07/15/2018 1557  ? HGB 11.5 (L) 11/22/2020 1421  ? HCT 35.0 (L) 11/22/2020 1421  ? PLT 194 11/22/2020 1421  ? MCV 92 11/22/2020 1421  ? MCH 30.3 11/22/2020 1421  ? MCH 27.1 07/15/2018 1557  ? MCHC 32.9 11/22/2020 1421  ? MCHC 30.9 07/15/2018 1557  ? RDW 12.8 11/22/2020 1421  ? LYMPHSABS 0.8 07/15/2018 1557  ? MONOABS 1.9 (H) 07/15/2018 1557  ? EOSABS 0.4 07/15/2018 1557  ? BASOSABS 0.1 07/15/2018 1557  ? ? ?CMP  ?   ?Component Value Date/Time  ? NA 138 07/15/2018 1557  ? K 3.6 07/15/2018 1557  ? CL 98 07/15/2018 1557  ? CO2 27 07/15/2018 1557  ? GLUCOSE 113 (H) 07/15/2018 1557  ? BUN 19 07/15/2018 1557  ? CREATININE 1.11 07/15/2018 1557  ? CALCIUM 9.3 07/15/2018 1557  ? PROT 8.1 07/15/2018 1557  ? ALBUMIN 2.8 (L) 07/15/2018 1557  ? AST 21 07/15/2018 1557  ? ALT 25 07/15/2018 1557  ? ALKPHOS 85 07/15/2018 1557  ? BILITOT 0.6 07/15/2018 1557  ? GFRNONAA >60 07/15/2018 1557  ? GFRAA >60 07/15/2018 1557  ? ? ? ?ASSESSMENT AND PLAN: ?61 year old male with several days of abdominal pain with profuse hematochezia with blood clots, which resolved spontaneously.  Given the patient's history of awakening from sleep with abdominal pain and the urge to defecate with passage of profuse blood and clots, this history is very suspicious for ischemic colitis.  A CT scan was not performed in the ED however.  Hemorrhoids are also a possibility, but seem less likely given the accompanying abdominal pain, as well as the lack of other episodes of bright red blood per rectum.  Therefore, this diagnosis will be speculative best.  The patient needs a colonoscopy, regardless.  We will perform a colonoscopy and exclude other possible etiologies for his hematochezia. ? ? ?Isolated episode of hematochezia with abdominal pain in Oct ?- Suspicious for ischemic colitis ? ?CRC screening ?- Colonoscopy ? ?The details, risks (including bleeding, perforation, infection, missed lesions, medication  reactions and possible hospitalization or surgery if complications occur), benefits, and alternatives to colonoscopy with possible biopsy and possible polypectomy were discussed with the patient and he consents to proceed.  ? ?Danniell Rotundo E. Nov, MD ?[facility] ? ? ?CC:  [facility], MD ? ?

## 2021-05-25 ENCOUNTER — Encounter: Payer: Self-pay | Admitting: Gastroenterology

## 2021-06-01 ENCOUNTER — Ambulatory Visit (AMBULATORY_SURGERY_CENTER): Payer: 59 | Admitting: Gastroenterology

## 2021-06-01 ENCOUNTER — Encounter: Payer: Self-pay | Admitting: Gastroenterology

## 2021-06-01 VITALS — BP 189/107 | HR 81 | Temp 98.2°F | Resp 18 | Ht 73.0 in | Wt 315.0 lb

## 2021-06-01 DIAGNOSIS — Z1211 Encounter for screening for malignant neoplasm of colon: Secondary | ICD-10-CM

## 2021-06-01 DIAGNOSIS — K56699 Other intestinal obstruction unspecified as to partial versus complete obstruction: Secondary | ICD-10-CM

## 2021-06-01 DIAGNOSIS — K921 Melena: Secondary | ICD-10-CM | POA: Diagnosis not present

## 2021-06-01 DIAGNOSIS — K529 Noninfective gastroenteritis and colitis, unspecified: Secondary | ICD-10-CM | POA: Diagnosis not present

## 2021-06-01 MED ORDER — SODIUM CHLORIDE 0.9 % IV SOLN
500.0000 mL | Freq: Once | INTRAVENOUS | Status: DC
Start: 2021-06-01 — End: 2021-06-01

## 2021-06-01 NOTE — Progress Notes (Signed)
Called to room to assist during endoscopic procedure.  Patient ID and intended procedure confirmed with present staff. Received instructions for my participation in the procedure from the performing physician.  

## 2021-06-01 NOTE — Progress Notes (Signed)
[facility] History and Physical ? ? ?Primary Care Physician:  Renaye Rakers, MD ? ? ?Reason for Procedure:   Colon cancer screening ? ?Plan:    Screening colonoscopy ? ? ? ? ?HPI: Roy [name] is a 61 y.o. male undergoing initial average risk screening colonoscopy.  He has no family history of colon cancer and no chronic GI symptoms. He had an episode of abdominal pain with hematochezia in October without recurrence of symptoms since then. ? ? ?Past Medical History:  ?Diagnosis Date  ? Abscess 02/27/2017  ? LEFT THIGH  ? Hypertension   ? ? ?Past Surgical History:  ?Procedure Laterality Date  ? CATARACT EXTRACTION Right   ? ELBOW SURGERY    ? HIP SURGERY Bilateral   ? replacement  ? IRRIGATION AND DEBRIDEMENT ABSCESS Left 02/28/2017  ? Procedure: IRRIGATION AND DEBRIDEMENT ABSCESS/THIGH;  Surgeon: Kinsinger, De Blanch, MD;  Location: [facility] OR;  Service: General;  Laterality: Left;  ? JOINT REPLACEMENT    ? ? ?Prior to Admission medications   ?Medication Sig Start Date End Date Taking? Authorizing Provider  ?amLODipine (NORVASC) 10 MG tablet Take 1 tablet (10 mg total) by mouth daily. ?Patient taking differently: Take 10 mg by mouth every morning. 03/02/17  Yes Meuth, Brooke A, PA-C  ?aspirin EC 81 MG tablet Take 81 mg by mouth daily. Swallow whole.   Yes [provider]  ?carvedilol (COREG) 12.5 MG tablet Take 12.5 mg by mouth 2 (two) times daily with a meal.   Yes [provider]  ?doxazosin (CARDURA) 4 MG tablet Take by mouth. 11/28/18  Yes [provider]  ?Multiple Vitamin (MULTIVITAMIN ADULT PO) Take by mouth.   Yes [provider]  ?Omega-3 Fatty Acids (FISH OIL PO) Take by mouth. ?Patient not taking: Reported on 06/01/2021    [provider]  ?pantoprazole (PROTONIX) 40 MG tablet Take 40 mg by mouth daily.  07/03/18 08/02/18  [provider]  ? ? ?Current Outpatient Medications  ?Medication Sig Dispense Refill  ? amLODipine (NORVASC) 10 MG tablet Take  1 tablet (10 mg total) by mouth daily. (Patient taking differently: Take 10 mg by mouth every morning.) 30 tablet 0  ? aspirin EC 81 MG tablet Take 81 mg by mouth daily. Swallow whole.    ? carvedilol (COREG) 12.5 MG tablet Take 12.5 mg by mouth 2 (two) times daily with a meal.    ? doxazosin (CARDURA) 4 MG tablet Take by mouth.    ? Multiple Vitamin (MULTIVITAMIN ADULT PO) Take by mouth.    ? Omega-3 Fatty Acids (FISH OIL PO) Take by mouth. (Patient not taking: Reported on 06/01/2021)    ? pantoprazole (PROTONIX) 40 MG tablet Take 40 mg by mouth daily.     ? ?Current Facility-Administered Medications  ?Medication Dose Route Frequency Provider Last Rate Last Admin  ? 0.9 %  sodium chloride infusion  500 mL Intravenous Once Jenel Lucks, MD      ? ? ?Allergies as of 06/01/2021  ? (No Known Allergies)  ? ? ?Family History  ?Problem Relation Age of Onset  ? Diabetes Father   ? Prostate cancer Paternal Uncle   ? Colon polyps Paternal Uncle   ? Colon cancer Neg Hx   ? Stomach cancer Neg Hx   ? Esophageal cancer Neg Hx   ? Pancreatic cancer Neg Hx   ? ? ?Social History  ? ?Socioeconomic History  ? Marital status: Married  ?  Spouse name: Not on file  ?  Number of children: Not on file  ? Years of education: Not on file  ? Highest education level: Not on file  ?Occupational History  ? Not on file  ?Tobacco Use  ? Smoking status: Never  ? Smokeless tobacco: Never  ?Vaping Use  ? Vaping Use: Never used  ?Substance and Sexual Activity  ? Alcohol use: Yes  ?  Comment: OCCASIONAL      ? Drug use: No  ? Sexual activity: Not on file  ?Other Topics Concern  ? Not on file  ?Social History Narrative  ? Not on file  ? ?Social Determinants of Health  ? ?Financial Resource Strain: Not on file  ?Food Insecurity: Not on file  ?Transportation Needs: Not on file  ?Physical Activity: Not on file  ?Stress: Not on file  ?Social Connections: Not on file  ?Intimate Partner Violence: Not on file  ? ? ?Review of Systems: ? ?All other review  of systems negative except as mentioned in the HPI. ? ?Physical Exam: ?Vital signs ?BP (!) 174/102   Pulse 88   Temp 98.2 ?F (36.8 ?C) (Skin)   Ht 6\' 1"  (1.854 m)   Wt (!) 315 lb (142.9 kg)   SpO2 98%   BMI 41.56 kg/m?  ? ?General:   Alert,  Well-developed, well-nourished, pleasant and cooperative in NAD ?Airway:  Mallampati 3 ?Lungs:  Clear throughout to auscultation.   ?Heart:  Regular rate and rhythm; no murmurs, clicks, rubs,  or gallops. ?Abdomen:  Soft, nontender and nondistended. Normal bowel sounds.   ?Neuro/Psych:  Normal mood and affect. A and O x 3 ? ? ?Savannha Welle E. , MD ?[facility] ? ?

## 2021-06-01 NOTE — Op Note (Signed)
[place] ?Patient Name: Roy [name] ?Procedure Date: 06/01/2021 2:46 PM ?MRN: [id number] ?Endoscopist: Marie Borowski E. Candis Schatz , MD ?Age: 61 ?Referring MD:  ?Date of Birth: 1960/02/11 ?Gender: Male ?Account #: [id number] ?Procedure:                Colonoscopy ?Indications:              Screening for colorectal malignant neoplasm, This  ?                          is the patient's first colonoscopy. THe patient had  ?                          an episode of abdominal pain and hematochezia in  ?                          October and went to the ER, but no CT was  ?                          performed. He has not had any further pain,  ?                          hematochezia or other GI symptoms since then. ?Medicines:                Monitored Anesthesia Care ?Procedure:                Pre-Anesthesia Assessment: ?                          - Prior to the procedure, a History and Physical  ?                          was performed, and patient medications and  ?                          allergies were reviewed. The patient's tolerance of  ?                          previous anesthesia was also reviewed. The risks  ?                          and benefits of the procedure and the sedation  ?                          options and risks were discussed with the patient.  ?                          All questions were answered, and informed consent  ?                          was obtained. Prior Anticoagulants: The patient has  ?                          taken no previous anticoagulant or antiplatelet  ?  agents. ASA Grade Assessment: III - A patient with  ?                          severe systemic disease. After reviewing the risks  ?                          and benefits, the patient was deemed in  ?                          satisfactory condition to undergo the procedure. ?                          After obtaining informed consent, the colonoscope  ?                          was passed under direct  vision. Throughout the  ?                          procedure, the patient's blood pressure, pulse, and  ?                          oxygen saturations were monitored continuously. The  ?                          CF [id number] was introduced through the anus  ?                          and advanced to the the sigmoid colon. The Olympus  ?                          Colonoscope [id number] was introduced through the and  ?                          advanced to the sigmoid colon. The colonoscopy was  ?                          technically difficult and complex due to bowel  ?                          stenosis. Successful completion of the procedure  ?                          not possible despite withdrawing the scope and  ?                          replacing with the ultraslim colonoscope. The  ?                          patient tolerated the procedure fairly well, but  ?                          each time the area of the stenosis was encountered,  ?  the patient would retch repeatedly. The quality of  ?                          the bowel preparation was adequate. ?Scope In: 2:51:44 PM ?Scope Out: 3:15:04 PM ?Total Procedure Duration: 0 hours 23 minutes 20 seconds  ?Findings:                 The perianal and digital rectal examinations were  ?                          normal. Pertinent negatives include normal  ?                          sphincter tone and no palpable rectal lesions. ?                          A benign-appearing, intrinsic severe stenosis was  ?                          found in the sigmoid colon and was non-traversed.  ?                          The colon distal to this stricture was  ?                          characterized by pale mucosa and areas of  ?                          submucosal hemorrhage. Biopsies were taken with a  ?                          cold forceps for histology. Estimated blood loss  ?                          was minimal. ?                          The  exam was otherwise normal throughout the  ?                          examined colon. ?                          The retroflexed view of the distal rectum and anal  ?                          verge was normal and showed no anal or rectal  ?                          abnormalities. ?Complications:            No immediate complications. ?Estimated Blood Loss:     Estimated blood loss was minimal. 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                          - The distal rectum and anal verge are normal on  ?                          retroflexion view. ?Recommendation:           - Patient has a contact number available for  ?                          emergencies. The signs and symptoms of potential  ?                          delayed complications were discussed with the  ?                          patient. Return to normal activities tomorrow.  ?                          Written discharge instructions were provided to the  ?                          patient. ?                          - Resume previous diet. ?                          - Continue present medications. ?                          - Perform CT scan (computed tomography) of the  ?                          abdomen and pelvis with IV/oral contrast at  ?                          appointment to be scheduled. ?Kanyon Bunn E. Candis Schatz, MD ?06/01/2021 3:31:49 PM ?This report has been signed electronically. ?

## 2021-06-01 NOTE — Patient Instructions (Signed)
Discharge instructions given. ?Biopsies taken. ?Contrast given in recovery. ?Resume previous medications. ?YOU HAD AN ENDOSCOPIC PROCEDURE TODAY AT THE [facility]:   Refer to the procedure report that was given to you for any specific questions about what was found during the examination.  If the procedure report does not answer your questions, please call your gastroenterologist to clarify.  If you requested that your care partner not be given the details of your procedure findings, then the procedure report has been included in a sealed envelope for you to review at your convenience later. ? ?YOU SHOULD EXPECT: Some feelings of bloating in the abdomen. Passage of more gas than usual.  Walking can help get rid of the air that was put into your GI tract during the procedure and reduce the bloating. If you had a lower endoscopy (such as a colonoscopy or flexible sigmoidoscopy) you may notice spotting of blood in your stool or on the toilet paper. If you underwent a bowel prep for your procedure, you may not have a normal bowel movement for a few days. ? ?Please Note:  You might notice some irritation and congestion in your nose or some drainage.  This is from the oxygen used during your procedure.  There is no need for concern and it should clear up in a day or so. ? ?SYMPTOMS TO REPORT IMMEDIATELY: ? ?Following lower endoscopy (colonoscopy or flexible sigmoidoscopy): ? Excessive amounts of blood in the stool ? Significant tenderness or worsening of abdominal pains ? Swelling of the abdomen that is new, acute ? Fever of 100?F or higher ? ? ?For urgent or emergent issues, a gastroenterologist can be reached at any hour by calling [phone]. ?Do not use MyChart messaging for urgent concerns.  ? ? ?DIET:  We do recommend a small meal at first, but then you may proceed to your regular diet.  Drink plenty of fluids but you should avoid alcoholic beverages for 24 hours. ? ?ACTIVITY:  You should plan to  take it easy for the rest of today and you should NOT DRIVE or use heavy machinery until tomorrow (because of the sedation medicines used during the test).   ? ?FOLLOW UP: ?Our staff will call the number listed on your records 48-72 hours following your procedure to check on you and address any questions or concerns that you may have regarding the information given to you following your procedure. If we do not reach you, we will leave a message.  We will attempt to reach you two times.  During this call, we will ask if you have developed any symptoms of COVID 19. If you develop any symptoms (ie: fever, flu-like symptoms, shortness of breath, cough etc.) before then, please call [phone].  If you test positive for Covid 19 in the 2 weeks post procedure, please call and report this information to [place].   ? ?If any biopsies were taken you will be contacted by phone or by letter within the next 1-3 weeks.  Please call us at [phone] if you have not heard about the biopsies in 3 weeks.  ? ? ?SIGNATURES/CONFIDENTIALITY: ?You and/or your care partner have signed paperwork which will be entered into your electronic medical record.  These signatures attest to the fact that that the information above on your After Visit Summary has been reviewed and is understood.  Full responsibility of the confidentiality of this discharge information lies with you and/or your care-partner.  ?

## 2021-06-01 NOTE — Progress Notes (Signed)
Pt's states no medical or surgical changes since previsit or office visit. VS assessed by C.W 

## 2021-06-01 NOTE — Progress Notes (Signed)
[place] giving abd pressure ?17 Pt starts wretching and "spitting" out white foaming substance that looks like spit.  Sedation halted. Abd pressure released.  Suctioning started ?1502 Pt awake and talking and following commands to cough and swallow to clear airway ?1504 stop with scope 892 ?1505 start with new scope and light sedation restarted since we havent made to cecum ?1510 pt starts doing the samething when sedated enough to close eyes.  No abd pressure was being given.  Sedation stopped.  Suctioned.  Same kind of fluid.   ?1515 pt awake and talking and swallowing and coughing on command.  Situation explained to pt and again in PACU. (Sats never dropped)  BS clear immediate post op ? ?

## 2021-06-01 NOTE — Progress Notes (Signed)
Office will call to schedule CT scan. ?

## 2021-06-02 ENCOUNTER — Other Ambulatory Visit: Payer: Self-pay

## 2021-06-02 DIAGNOSIS — K56699 Other intestinal obstruction unspecified as to partial versus complete obstruction: Secondary | ICD-10-CM

## 2021-06-03 ENCOUNTER — Telehealth: Payer: Self-pay | Admitting: *Deleted

## 2021-06-03 NOTE — Telephone Encounter (Signed)
?  Follow up Call- ? ? ?  06/01/2021  ?  1:57 PM  ?Call back number  ?Post procedure Call Back phone  # [phone]  ?Permission to leave phone message Yes  ?  ? ?Patient questions: ? ?Do you have a fever, pain , or abdominal swelling? No. ?Pain Score  0 * ? ?Have you tolerated food without any problems? Yes.   ? ?Have you been able to return to your normal activities? Yes.   ? ?Do you have any questions about your discharge instructions: ?Diet   No. ?Medications  No. ?Follow up visit  No. ? ?Do you have questions or concerns about your Care? No. ? ?Actions: ?* If pain score is 4 or above: ?No action needed, pain <4. ? ? ?

## 2021-06-13 ENCOUNTER — Ambulatory Visit (HOSPITAL_BASED_OUTPATIENT_CLINIC_OR_DEPARTMENT_OTHER)
Admission: RE | Admit: 2021-06-13 | Discharge: 2021-06-13 | Disposition: A | Discharge status: Home / Self Care | Payer: 59 | Source: Ambulatory Visit | Attending: Gastroenterology | Admitting: Gastroenterology

## 2021-06-13 ENCOUNTER — Encounter (HOSPITAL_BASED_OUTPATIENT_CLINIC_OR_DEPARTMENT_OTHER): Payer: Self-pay

## 2021-06-13 DIAGNOSIS — K56699 Other intestinal obstruction unspecified as to partial versus complete obstruction: Secondary | ICD-10-CM | POA: Insufficient documentation

## 2021-06-13 MED ORDER — IOHEXOL 300 MG/ML  SOLN
100.0000 mL | Freq: Once | INTRAMUSCULAR | Status: AC | PRN
  Administered 2021-06-13: 100 mL via INTRAVENOUS

## 2021-06-16 ENCOUNTER — Other Ambulatory Visit: Payer: Self-pay | Admitting: Gastroenterology

## 2021-06-16 ENCOUNTER — Other Ambulatory Visit: Payer: Self-pay

## 2021-06-16 DIAGNOSIS — K56699 Other intestinal obstruction unspecified as to partial versus complete obstruction: Secondary | ICD-10-CM

## 2021-06-16 MED ORDER — SUTAB 1479-225-188 MG PO TABS: ORAL_TABLET | ORAL | 0 refills | Status: DC

## 2021-06-16 NOTE — Progress Notes (Signed)
Roy [name],  As we discussed on the phone today, the biopsies of the stricture in your colon showed nonspecific inflammatory changes.  No precancerous or cancerous cells were seen.  This is good news.   Also, the CT scan showed evidence of inflammation in the left colon with lymph nodes and presumed narrowing.  No definitive mass was seen, but this cannot be completely excluded. 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  I think it would be reasonable to attempt another colonoscopy to see if we can transverse the stricture and clear of the proximal colon and also completely exclude a malignancy. We will contact you to set up another colonoscopy.

## 2021-06-16 NOTE — Progress Notes (Signed)
Roy [name],  As we discussed on the phone today, the biopsies of the stricture in your colon showed nonspecific inflammatory changes.  No precancerous or cancerous cells were seen.  This is good news.   Also, the CT scan showed evidence of inflammation in the left colon with lymph nodes and presumed narrowing.  No definitive mass was seen, but this cannot be completely excluded. 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  I think it would be reasonable to attempt another colonoscopy to see if we can transverse the stricture and clear of the proximal colon and also completely exclude a malignancy. We will contact you to set up another colonoscopy.  [name],  Can you schedule a repeat colonoscopy for Roy [name]?

## 2021-06-17 ENCOUNTER — Other Ambulatory Visit: Payer: Self-pay

## 2021-06-17 MED ORDER — SUTAB 1479-225-188 MG PO TABS: 1.0000 | ORAL_TABLET | Freq: Once | ORAL | 0 refills | Status: AC

## 2021-07-05 ENCOUNTER — Ambulatory Visit (AMBULATORY_SURGERY_CENTER): Payer: 59 | Admitting: Gastroenterology

## 2021-07-05 ENCOUNTER — Encounter: Payer: Self-pay | Admitting: Gastroenterology

## 2021-07-05 VITALS — BP 124/77 | HR 82 | Temp 96.8°F | Resp 14 | Ht 73.0 in | Wt 315.0 lb

## 2021-07-05 DIAGNOSIS — Z1212 Encounter for screening for malignant neoplasm of rectum: Secondary | ICD-10-CM | POA: Diagnosis not present

## 2021-07-05 DIAGNOSIS — Z1211 Encounter for screening for malignant neoplasm of colon: Secondary | ICD-10-CM | POA: Diagnosis not present

## 2021-07-05 DIAGNOSIS — K921 Melena: Secondary | ICD-10-CM

## 2021-07-05 DIAGNOSIS — K5289 Other specified noninfective gastroenteritis and colitis: Secondary | ICD-10-CM

## 2021-07-05 DIAGNOSIS — K529 Noninfective gastroenteritis and colitis, unspecified: Secondary | ICD-10-CM | POA: Diagnosis not present

## 2021-07-05 MED ORDER — SODIUM CHLORIDE 0.9 % IV SOLN: 500.0000 mL | Freq: Once | INTRAVENOUS | Status: DC

## 2021-07-05 NOTE — Op Note (Signed)
[facility] Patient Name: Roy [name] Procedure Date: 07/05/2021 10:36 AM MRN: [id number] Endoscopist: Lorin PicketScott E. Tomasa Randunningham , MD Age: 61 Referring MD:  Date of Birth: 05-17-1960 Gender: Male Account #: [id number] Procedure:                Colonoscopy Indications:              Screening for colorectal malignant neoplasm. The                            patient underwent a screening colonoscopy in March                            which was incomplete due to a suspected colonic                            stricture in the left colon. Subsequent CT did not                            definitively demonstrate a stricture. Repeat                            colonoscopy to reassess for stricture and complete                            colonoscopy. He is asymptomatic. Medicines:                Monitored Anesthesia Care Procedure:                Pre-Anesthesia Assessment:                           - Prior to the procedure, a History and Physical                            was performed, and patient medications and                            allergies were reviewed. The patient's tolerance of                            previous anesthesia was also reviewed. The risks                            and benefits of the procedure and the sedation                            options and risks were discussed with the patient.                            All questions were answered, and informed consent                            was obtained. Prior Anticoagulants: The patient has  taken no previous anticoagulant or antiplatelet                            agents. ASA Grade Assessment: III - A patient with                            severe systemic disease. After reviewing the risks                            and benefits, the patient was deemed in                            satisfactory condition to undergo the procedure.                           After obtaining informed  consent, the colonoscope                            was passed under direct vision. Throughout the                            procedure, the patient's blood pressure, pulse, and                            oxygen saturations were monitored continuously. The                            0441 PCF-H190TL Slim SB Colonoscope was introduced                            through the anus and advanced to the the ileocecal                            valve. The colonoscopy was somewhat difficult due                            to inadequate bowel prep and significant looping.                            Successful completion of the procedure was aided by                            using manual pressure. The patient tolerated the                            procedure well. The quality of the bowel                            preparation was inadequate. The ileocecal valve and                            rectum were photographed. The appendiceal orifice  was not visualized due to copious solid stool in                            the cecum that was not amenable to lavage/suction.                            The bowel preparation used was Sutab via split dose                            instruction. Scope In: 10:55:23 AM Scope Out: 11:26:30 AM Scope Withdrawal Time: 0 hours 18 minutes 12 seconds  Total Procedure Duration: 0 hours 31 minutes 7 seconds  Findings:                 The perianal and digital rectal examinations were                            normal. Pertinent negatives include normal                            sphincter tone and no palpable rectal lesions.                           In the proximal sigmoid/distal descending colon                            (from about 30-37 cm), the mucosa was characterized                            by patchy areas of decreased vascularity as well as                            erythema and submucosal hemorrhage. This area did                             not insufflate well and visualization was poor,                            although the lumen seemed narrowed. No definitive                            stricture or stensosis was identified and the area                            was traversed with minimal resistance. Biopsies                            were taken with a cold forceps for histology in                            this area of abnormal mucosa and narrowed lumen.                            Estimated blood  loss was minimal.                           Multiple small and large-mouthed diverticula were                            found in the sigmoid colon, descending colon,                            transverse colon and ascending colon.                           The exam was otherwise normal throughout the                            examined colon.                           The retroflexed view of the distal rectum and anal                            verge was normal and showed no anal or rectal                            abnormalities. Complications:            No immediate complications. Estimated Blood Loss:     Estimated blood loss was minimal. Impression:               - Preparation of the colon was inadequate.                           - Area of luminal narrowing with abnormal mucosa in                            the proximal sigmoid/distal descending colon.                            Suspect this is related to diverticular disease. No                            mass identified. No discrete stricture. Biopsied.                           - Diverticulosis in the sigmoid colon, in the                            descending colon, in the transverse colon and in                            the ascending colon.                           - The distal rectum and anal verge are normal on  retroflexion view. Recommendation:           - Patient has a contact number available for                             emergencies. The signs and symptoms of potential                            delayed complications were discussed with the                            patient. Return to normal activities tomorrow.                            Written discharge instructions were provided to the                            patient.                           - Resume previous diet.                           - Continue present medications.                           - Await pathology results.                           - Repeat colonoscopy in 1 year because the bowel                            preparation was suboptimal.                           - Recommend 2 day prep with next colonscopy                           - Do not recommend further evaluation for colon                            stricture. Do not recommend surgical consultation. Teran Daughenbaugh E. Tomasa Rand, MD 07/05/2021 11:41:10 AM This report has been signed electronically.

## 2021-07-05 NOTE — Progress Notes (Unsigned)
[facility] History and Physical   Primary Care Physician:  Lucianne Lei, MD   Reason for Procedure:   Suspected colonic stricture  Plan:    Colonoscopy     HPI: Roy [name] is a 61 y.o. male undergoing repeat colonoscopy due to incomplete colonoscopy last month.  The colonoscopy was incomplete due to suspected colonic stricture in the sigmoid/descending colon.  A subsequent CT did not demonstrate a definitive stricture.  He denies any symptoms of a colonic stricture, reporting normal formed bowel movements without abdominal pain or bloating.   He has a history of C diff in 2020 and had an episode of abdominal pain and hematochezia in Oct 2022 prompting an ER visit.  He has no family history of colon cancer.   Past Medical History:  Diagnosis Date   Abscess 02/27/2017   LEFT THIGH   Hypertension     Past Surgical History:  Procedure Laterality Date   CATARACT EXTRACTION Right    ELBOW SURGERY     HIP SURGERY Bilateral    replacement   IRRIGATION AND DEBRIDEMENT ABSCESS Left 02/28/2017   Procedure: IRRIGATION AND DEBRIDEMENT ABSCESS/THIGH;  Surgeon: Kinsinger, Arta Bruce, MD;  Location: [place];  Service: General;  Laterality: Left;   JOINT REPLACEMENT      Prior to Admission medications   Medication Sig Start Date End Date Taking? Authorizing Provider  amLODipine (NORVASC) 10 MG tablet Take 1 tablet (10 mg total) by mouth daily. Patient taking differently: Take 10 mg by mouth every morning. 03/02/17  Yes Meuth, Blaine Hamper, PA-C  aspirin EC 81 MG tablet Take 81 mg by mouth daily. Swallow whole.   Yes [provider]  carvedilol (COREG) 12.5 MG tablet Take 12.5 mg by mouth 2 (two) times daily with a meal.   Yes [provider]  Multiple Vitamin (MULTIVITAMIN ADULT PO) Take by mouth.   Yes [provider]  doxazosin (CARDURA) 4 MG tablet Take by mouth. 11/28/18   [provider]  Omega-3 Fatty Acids (FISH OIL PO) Take by  mouth. Patient not taking: Reported on 06/01/2021    [provider]  pantoprazole (PROTONIX) 40 MG tablet Take 40 mg by mouth daily.  07/03/18 08/02/18  [provider]    Current Outpatient Medications  Medication Sig Dispense Refill   amLODipine (NORVASC) 10 MG tablet Take 1 tablet (10 mg total) by mouth daily. (Patient taking differently: Take 10 mg by mouth every morning.) 30 tablet 0   aspirin EC 81 MG tablet Take 81 mg by mouth daily. Swallow whole.     carvedilol (COREG) 12.5 MG tablet Take 12.5 mg by mouth 2 (two) times daily with a meal.     Multiple Vitamin (MULTIVITAMIN ADULT PO) Take by mouth.     doxazosin (CARDURA) 4 MG tablet Take by mouth.     Omega-3 Fatty Acids (FISH OIL PO) Take by mouth. (Patient not taking: Reported on 06/01/2021)     pantoprazole (PROTONIX) 40 MG tablet Take 40 mg by mouth daily.      Current Facility-Administered Medications  Medication Dose Route Frequency Provider Last Rate Last Admin   0.9 %  sodium chloride infusion  500 mL Intravenous Once Daryel November, MD        Allergies as of 07/05/2021   (No Known Allergies)    Family History  Problem Relation Age of Onset   Diabetes Father    Prostate cancer Paternal Uncle    Colon polyps Paternal Uncle  Colon cancer Neg Hx    Stomach cancer Neg Hx    Esophageal cancer Neg Hx    Pancreatic cancer Neg Hx     Social History   Socioeconomic History   Marital status: Married    Spouse name: Not on file   Number of children: Not on file   Years of education: Not on file   Highest education level: Not on file  Occupational History   Not on file  Tobacco Use   Smoking status: Never   Smokeless tobacco: Never  Vaping Use   Vaping Use: Never used  Substance and Sexual Activity   Alcohol use: Yes    Comment: OCCASIONAL       Drug use: No   Sexual activity: Not on file  Other Topics Concern   Not on file  Social History Narrative   Not on file   Social Determinants  of Health   Financial Resource Strain: Not on file  Food Insecurity: Not on file  Transportation Needs: Not on file  Physical Activity: Not on file  Stress: Not on file  Social Connections: Not on file  Intimate Partner Violence: Not on file    Review of Systems:  All other review of systems negative except as mentioned in the HPI.  Physical Exam: Vital signs BP (!) 185/112   Pulse 80   Temp (!) 96.8 F (36 C) (Temporal)   Resp 14   Ht 6\' 1"  (1.854 m)   Wt (!) 315 lb (142.9 kg)   SpO2 99%   BMI 41.56 kg/m   General:   Alert,  Well-developed, well-nourished, pleasant and cooperative in NAD Airway:  Mallampati 2 Lungs:  Clear throughout to auscultation.   Heart:  Regular rate and rhythm; no murmurs, clicks, rubs,  or gallops. Abdomen:  Soft, nontender and nondistended. Normal bowel sounds.   Neuro/Psych:  Normal mood and affect. A and O x 3   Tameyah Koch E. Candis Schatz, MD [facility]

## 2021-07-05 NOTE — Progress Notes (Unsigned)
Called to room to assist during endoscopic procedure.  Patient ID and intended procedure confirmed with present staff. Received instructions for my participation in the procedure from the performing physician.  

## 2021-07-05 NOTE — Patient Instructions (Signed)
YOU HAD AN ENDOSCOPIC PROCEDURE TODAY AT THE [facility]:   Refer to the procedure report that was given to you for any specific questions about what was found during the examination.  If the procedure report does not answer your questions, please call your gastroenterologist to clarify.  If you requested that your care partner not be given the details of your procedure findings, then the procedure report has been included in a sealed envelope for you to review at your convenience later.  YOU SHOULD EXPECT: Some feelings of bloating in the abdomen. Passage of more gas than usual.  Walking can help get rid of the air that was put into your GI tract during the procedure and reduce the bloating. If you had a lower endoscopy (such as a colonoscopy or flexible sigmoidoscopy) you may notice spotting of blood in your stool or on the toilet paper. If you underwent a bowel prep for your procedure, you may not have a normal bowel movement for a few days.  Please Note:  You might notice some irritation and congestion in your nose or some drainage.  This is from the oxygen used during your procedure.  There is no need for concern and it should clear up in a day or so.  SYMPTOMS TO REPORT IMMEDIATELY:  Following lower endoscopy (colonoscopy or flexible sigmoidoscopy):  Excessive amounts of blood in the stool  Significant tenderness or worsening of abdominal pains  Swelling of the abdomen that is new, acute  Fever of 100F or higher  For urgent or emergent issues, a gastroenterologist can be reached at any hour by calling [phone]. Do not use MyChart messaging for urgent concerns.    DIET:  We do recommend a small meal at first, but then you may proceed to your regular diet.  Drink plenty of fluids but you should avoid alcoholic beverages for 24 hours.  ACTIVITY:  You should plan to take it easy for the rest of today and you should NOT DRIVE or use heavy machinery until tomorrow (because of  the sedation medicines used during the test).    FOLLOW UP: Our staff will call the number listed on your records 24-72 hours following your procedure to check on you and address any questions or concerns that you may have regarding the information given to you following your procedure. If we do not reach you, we will leave a message.  We will attempt to reach you two times.  During this call, we will ask if you have developed any symptoms of COVID 19. If you develop any symptoms (ie: fever, flu-like symptoms, shortness of breath, cough etc.) before then, please call [phone].  If you test positive for Covid 19 in the 2 weeks post procedure, please call and report this information to us.    If any biopsies were taken you will be contacted by phone or by letter within the next 1-3 weeks.  Please call us at [phone] if you have not heard about the biopsies in 3 weeks.    SIGNATURES/CONFIDENTIALITY: You and/or your care partner have signed paperwork which will be entered into your electronic medical record.  These signatures attest to the fact that that the information above on your After Visit Summary has been reviewed and is understood.  Full responsibility of the confidentiality of this discharge information lies with you and/or your care-partner.  

## 2021-07-05 NOTE — Progress Notes (Unsigned)
Report to PACU, RN, vss, BBS= Clear.  

## 2021-07-06 ENCOUNTER — Telehealth: Payer: Self-pay

## 2021-07-06 NOTE — Telephone Encounter (Signed)
  Follow up Call-     07/05/2021    9:25 AM 06/01/2021    1:57 PM  Call back number  Post procedure Call Back phone  # [phone]  Permission to leave phone message Yes Yes     Patient questions:  Do you have a fever, pain , or abdominal swelling? Yes.   Pain Score  2 *  Have you tolerated food without any problems? Yes.    Have you been able to return to your normal activities? Yes.    Do you have any questions about your discharge instructions: Diet   No. Medications  No. Follow up visit  No.  Do you have questions or concerns about your Care? Yes.    Actions: * If pain score is 4 or above: No action needed, pain <4. Patient is still feeling bloated and gassy.  He is rating his pain a "2".  Has no appetite but has been able to eat and drink.  Advised patient to drink warm fluids and walk around to help with the gas. Patient also advised to call back if pain worsens.  Patient agreeable to that plan.

## 2021-07-06 NOTE — Telephone Encounter (Signed)
Left message on follow up call. 

## 2021-07-11 ENCOUNTER — Telehealth: Payer: Self-pay | Admitting: Gastroenterology

## 2021-07-11 NOTE — Telephone Encounter (Signed)
Pt states he had colon done 07/05/21. Reports since procedure he has been having bloating and abdominal cramping along with diarrhea. States so far today he has already had 4 diarrhea stools. Reports the bloating is quite uncomfortable. Please advise.

## 2021-07-12 ENCOUNTER — Other Ambulatory Visit: Payer: Self-pay

## 2021-07-12 DIAGNOSIS — R197 Diarrhea, unspecified: Secondary | ICD-10-CM

## 2021-07-12 MED ORDER — DICYCLOMINE HCL 20 MG PO TABS: 20.0000 mg | ORAL_TABLET | Freq: Four times a day (QID) | ORAL | 1 refills | Status: DC | PRN

## 2021-07-12 NOTE — Progress Notes (Signed)
Roy [name], The biopsies of your colon showed very mild inflammatory changes (colitis), but did not show any evidence of chronic colitis that would suggest inflammatory bowel disease.  There is no evidence of infection and no evidence of cancer or precancerous changes.  The inflammation is most likely related to your diverticulosis.  I recommend daily fiber supplementation and a high-fiber diet. As discussed, due to the inadequate bowel prep, I recommend we repeat a colonoscopy in 1 year.

## 2021-07-12 NOTE — Telephone Encounter (Signed)
Spoke with pt and he is aware of Dr. Milas Hock recommendations. He knows to come for labs, orders in epic. Script sent to pharmacy.

## 2021-11-24 DIAGNOSIS — R809 Proteinuria, unspecified: Secondary | ICD-10-CM | POA: Diagnosis not present

## 2021-11-24 DIAGNOSIS — K591 Functional diarrhea: Secondary | ICD-10-CM | POA: Diagnosis not present

## 2021-11-24 DIAGNOSIS — E782 Mixed hyperlipidemia: Secondary | ICD-10-CM | POA: Diagnosis not present

## 2021-11-24 DIAGNOSIS — M1000 Idiopathic gout, unspecified site: Secondary | ICD-10-CM | POA: Diagnosis not present

## 2021-11-24 DIAGNOSIS — I119 Hypertensive heart disease without heart failure: Secondary | ICD-10-CM | POA: Diagnosis not present

## 2021-11-24 DIAGNOSIS — R635 Abnormal weight gain: Secondary | ICD-10-CM | POA: Diagnosis not present

## 2021-11-24 DIAGNOSIS — G4730 Sleep apnea, unspecified: Secondary | ICD-10-CM | POA: Diagnosis not present

## 2021-11-24 DIAGNOSIS — N189 Chronic kidney disease, unspecified: Secondary | ICD-10-CM | POA: Diagnosis not present

## 2021-11-24 DIAGNOSIS — I10 Essential (primary) hypertension: Secondary | ICD-10-CM | POA: Diagnosis not present

## 2021-12-15 ENCOUNTER — Other Ambulatory Visit: Payer: Self-pay | Admitting: Family Medicine

## 2021-12-15 ENCOUNTER — Ambulatory Visit
Admission: RE | Admit: 2021-12-15 | Discharge: 2021-12-15 | Disposition: A | Discharge status: Home / Self Care | Payer: 59 | Source: Ambulatory Visit | Attending: Family Medicine | Admitting: Family Medicine

## 2021-12-15 DIAGNOSIS — M130 Polyarthritis, unspecified: Secondary | ICD-10-CM

## 2021-12-15 DIAGNOSIS — K591 Functional diarrhea: Secondary | ICD-10-CM | POA: Diagnosis not present

## 2021-12-15 DIAGNOSIS — R197 Diarrhea, unspecified: Secondary | ICD-10-CM | POA: Diagnosis not present

## 2021-12-15 DIAGNOSIS — M7989 Other specified soft tissue disorders: Secondary | ICD-10-CM | POA: Diagnosis not present

## 2021-12-15 DIAGNOSIS — Z6839 Body mass index (BMI) 39.0-39.9, adult: Secondary | ICD-10-CM | POA: Diagnosis not present

## 2021-12-15 DIAGNOSIS — M1000 Idiopathic gout, unspecified site: Secondary | ICD-10-CM | POA: Diagnosis not present

## 2021-12-15 DIAGNOSIS — I10 Essential (primary) hypertension: Secondary | ICD-10-CM | POA: Diagnosis not present

## 2021-12-15 DIAGNOSIS — K6389 Other specified diseases of intestine: Secondary | ICD-10-CM | POA: Diagnosis not present

## 2021-12-15 DIAGNOSIS — R140 Abdominal distension (gaseous): Secondary | ICD-10-CM | POA: Diagnosis not present

## 2021-12-19 ENCOUNTER — Encounter: Payer: Self-pay | Admitting: Family Medicine

## 2021-12-19 ENCOUNTER — Other Ambulatory Visit: Payer: Self-pay | Admitting: Family Medicine

## 2021-12-19 DIAGNOSIS — K567 Ileus, unspecified: Secondary | ICD-10-CM

## 2022-01-20 ENCOUNTER — Encounter: Payer: Self-pay | Admitting: Family Medicine

## 2022-01-25 ENCOUNTER — Inpatient Hospital Stay: Admission: RE | Admit: 2022-01-25 | Payer: 59 | Source: Ambulatory Visit

## 2022-01-26 DIAGNOSIS — Z6839 Body mass index (BMI) 39.0-39.9, adult: Secondary | ICD-10-CM | POA: Diagnosis not present

## 2022-01-26 DIAGNOSIS — Z833 Family history of diabetes mellitus: Secondary | ICD-10-CM | POA: Diagnosis not present

## 2022-01-26 DIAGNOSIS — Z96643 Presence of artificial hip joint, bilateral: Secondary | ICD-10-CM | POA: Diagnosis not present

## 2022-01-26 DIAGNOSIS — I10 Essential (primary) hypertension: Secondary | ICD-10-CM | POA: Diagnosis not present

## 2022-01-26 DIAGNOSIS — Z809 Family history of malignant neoplasm, unspecified: Secondary | ICD-10-CM | POA: Diagnosis not present

## 2022-01-26 DIAGNOSIS — Z8249 Family history of ischemic heart disease and other diseases of the circulatory system: Secondary | ICD-10-CM | POA: Diagnosis not present

## 2022-02-28 ENCOUNTER — Inpatient Hospital Stay: Admission: RE | Admit: 2022-02-28 | Payer: 59 | Source: Ambulatory Visit

## 2022-03-21 ENCOUNTER — Other Ambulatory Visit: Payer: 59

## 2022-05-09 DIAGNOSIS — E782 Mixed hyperlipidemia: Secondary | ICD-10-CM | POA: Diagnosis not present

## 2022-05-09 DIAGNOSIS — Z6838 Body mass index (BMI) 38.0-38.9, adult: Secondary | ICD-10-CM | POA: Diagnosis not present

## 2022-05-09 DIAGNOSIS — G4730 Sleep apnea, unspecified: Secondary | ICD-10-CM | POA: Diagnosis not present

## 2022-05-09 DIAGNOSIS — N189 Chronic kidney disease, unspecified: Secondary | ICD-10-CM | POA: Diagnosis not present

## 2022-05-09 DIAGNOSIS — I10 Essential (primary) hypertension: Secondary | ICD-10-CM | POA: Diagnosis not present

## 2022-05-09 DIAGNOSIS — E6609 Other obesity due to excess calories: Secondary | ICD-10-CM | POA: Diagnosis not present

## 2022-06-06 DIAGNOSIS — E1169 Type 2 diabetes mellitus with other specified complication: Secondary | ICD-10-CM | POA: Diagnosis not present

## 2022-06-06 DIAGNOSIS — I10 Essential (primary) hypertension: Secondary | ICD-10-CM | POA: Diagnosis not present

## 2022-06-06 DIAGNOSIS — E781 Pure hyperglyceridemia: Secondary | ICD-10-CM | POA: Diagnosis not present

## 2022-06-27 DIAGNOSIS — G4730 Sleep apnea, unspecified: Secondary | ICD-10-CM | POA: Diagnosis not present

## 2022-06-27 DIAGNOSIS — I10 Essential (primary) hypertension: Secondary | ICD-10-CM | POA: Diagnosis not present

## 2022-06-27 DIAGNOSIS — E1169 Type 2 diabetes mellitus with other specified complication: Secondary | ICD-10-CM | POA: Diagnosis not present

## 2022-06-27 DIAGNOSIS — K591 Functional diarrhea: Secondary | ICD-10-CM | POA: Diagnosis not present

## 2022-09-19 ENCOUNTER — Other Ambulatory Visit: Payer: Self-pay | Admitting: Family Medicine

## 2022-09-19 ENCOUNTER — Ambulatory Visit
Admission: RE | Admit: 2022-09-19 | Discharge: 2022-09-19 | Disposition: A | Discharge status: Home / Self Care | Payer: 59 | Source: Ambulatory Visit | Attending: Family Medicine | Admitting: Family Medicine

## 2022-09-19 DIAGNOSIS — M7989 Other specified soft tissue disorders: Secondary | ICD-10-CM | POA: Diagnosis not present

## 2022-09-19 DIAGNOSIS — M19042 Primary osteoarthritis, left hand: Secondary | ICD-10-CM

## 2022-09-19 DIAGNOSIS — M79641 Pain in right hand: Secondary | ICD-10-CM

## 2022-09-19 DIAGNOSIS — F32A Depression, unspecified: Secondary | ICD-10-CM | POA: Diagnosis not present

## 2022-09-19 DIAGNOSIS — M79642 Pain in left hand: Secondary | ICD-10-CM | POA: Diagnosis not present

## 2022-09-19 DIAGNOSIS — M19012 Primary osteoarthritis, left shoulder: Secondary | ICD-10-CM | POA: Diagnosis not present

## 2022-09-19 DIAGNOSIS — I10 Essential (primary) hypertension: Secondary | ICD-10-CM | POA: Diagnosis not present

## 2022-10-31 DIAGNOSIS — R141 Gas pain: Secondary | ICD-10-CM | POA: Diagnosis not present

## 2022-10-31 DIAGNOSIS — E1122 Type 2 diabetes mellitus with diabetic chronic kidney disease: Secondary | ICD-10-CM | POA: Diagnosis not present

## 2022-10-31 DIAGNOSIS — N189 Chronic kidney disease, unspecified: Secondary | ICD-10-CM | POA: Diagnosis not present

## 2022-10-31 DIAGNOSIS — E1169 Type 2 diabetes mellitus with other specified complication: Secondary | ICD-10-CM | POA: Diagnosis not present

## 2022-10-31 DIAGNOSIS — I129 Hypertensive chronic kidney disease with stage 1 through stage 4 chronic kidney disease, or unspecified chronic kidney disease: Secondary | ICD-10-CM | POA: Diagnosis not present

## 2022-12-11 ENCOUNTER — Encounter: Payer: Self-pay | Admitting: Gastroenterology

## 2022-12-12 DIAGNOSIS — E1122 Type 2 diabetes mellitus with diabetic chronic kidney disease: Secondary | ICD-10-CM | POA: Diagnosis not present

## 2022-12-12 DIAGNOSIS — Z23 Encounter for immunization: Secondary | ICD-10-CM | POA: Diagnosis not present

## 2022-12-12 DIAGNOSIS — G4730 Sleep apnea, unspecified: Secondary | ICD-10-CM | POA: Diagnosis not present

## 2022-12-12 DIAGNOSIS — E782 Mixed hyperlipidemia: Secondary | ICD-10-CM | POA: Diagnosis not present

## 2022-12-12 DIAGNOSIS — N189 Chronic kidney disease, unspecified: Secondary | ICD-10-CM | POA: Diagnosis not present

## 2022-12-12 DIAGNOSIS — R0683 Snoring: Secondary | ICD-10-CM | POA: Diagnosis not present

## 2022-12-12 DIAGNOSIS — I129 Hypertensive chronic kidney disease with stage 1 through stage 4 chronic kidney disease, or unspecified chronic kidney disease: Secondary | ICD-10-CM | POA: Diagnosis not present

## 2023-02-01 DIAGNOSIS — E781 Pure hyperglyceridemia: Secondary | ICD-10-CM | POA: Diagnosis not present

## 2023-02-01 DIAGNOSIS — I10 Essential (primary) hypertension: Secondary | ICD-10-CM | POA: Diagnosis not present

## 2023-02-01 DIAGNOSIS — E1169 Type 2 diabetes mellitus with other specified complication: Secondary | ICD-10-CM | POA: Diagnosis not present

## 2023-07-16 LAB — AMB RESULTS CONSOLE CBG: Glucose: 115

## 2023-08-20 LAB — AMB RESULTS CONSOLE CBG: Glucose: 107

## 2023-09-07 ENCOUNTER — Telehealth: Payer: Self-pay

## 2023-09-07 NOTE — Progress Notes (Signed)
   09/07/2023  Patient ID: Roy [name], male   DOB: 1960/06/09, 63 y.o.   MRN: [id number]  Contacted patient regarding medication adherence from a quality report for [facility]. The patient is at-risk for failing [facility].  Left HIPAA compliant voicemail for patient to return my call at their convenience.  Heather Factor, PharmD Clinical Pharmacist  [phone]

## 2023-09-20 NOTE — Progress Notes (Signed)
   09/20/2023  Patient ID: Roy [name], male   DOB: Oct 13, 1960, 63 y.o.   MRN: [id number]  Contacted patient regarding medication adherence from a quality report for [facility]. The patient is at-risk for failing [facility].   Per [employer] and payer portal fill history:  Telmisartan-Hydrochlorothiazide  40-12.5 mg - Last filled 07/24/23 for 30-day supply.   I left a HIPAA compliant voicemail for the pt to return my phone call.   Heather Factor, PharmD Clinical Pharmacist  [phone]

## 2023-10-04 NOTE — Progress Notes (Signed)
   10/04/2023  Patient ID: Roy [name], male   DOB: 03-22-1960, 63 y.o.   MRN: [id number]  Contacted patient regarding medication adherence from a quality report for [facility]. The patient is at-risk for failing [facility].   Per [employer] and payer portal fill history:  Telmisartan-Hydrochlorothiazide  40-12.5 mg - Last filled 07/24/23 for 30-day supply.   I was able to speak to the patient today. He authorized refills of his telmisartan-hydrochlorothiazide  and carvedilol. I facilitated getting those prescriptions refilled at [facility].   I will continue to follow up for adherence monitoring.  Heather Factor, PharmD Clinical Pharmacist  [phone]

## 2023-10-15 LAB — AMB RESULTS CONSOLE CBG: Glucose: 109

## 2023-10-18 ENCOUNTER — Telehealth: Payer: Self-pay | Admitting: Nurse Practitioner

## 2023-10-18 ENCOUNTER — Encounter: Payer: Self-pay | Admitting: Nurse Practitioner

## 2023-10-18 VITALS — BP 156/101 | HR 80 | Resp 16 | Ht 72.0 in | Wt 280.0 lb

## 2023-10-18 DIAGNOSIS — Z113 Encounter for screening for infections with a predominantly sexual mode of transmission: Secondary | ICD-10-CM

## 2023-10-18 DIAGNOSIS — I10 Essential (primary) hypertension: Secondary | ICD-10-CM

## 2023-10-18 DIAGNOSIS — N189 Chronic kidney disease, unspecified: Secondary | ICD-10-CM | POA: Diagnosis not present

## 2023-10-18 MED ORDER — AMLODIPINE BESYLATE 10 MG PO TABS: 10.0000 mg | ORAL_TABLET | Freq: Every day | ORAL | 3 refills | Status: DC

## 2023-10-18 NOTE — Progress Notes (Signed)
 The patient presented for a primary care visit on 10/18/2023 to establish care. Blood pressure screening was conducted, and the result was 156/101. During the appointment, the patient reported a food SDOH concern. As a precaution, the patient was provided with [facility] food resources via [place] via [employer].  A review of the patient's chart revealed that they have Dr. Kennieth Leech as his PCP and has a future appointment on 10/25/2023.   An additional follow up will be done according to the health equity team's protocol.

## 2023-10-18 NOTE — Progress Notes (Signed)
 Pt presents to establish care -hypertension concerns  -don't know if current medication are working

## 2023-10-18 NOTE — Progress Notes (Signed)
 Virtual Primary Care Telehealth Visit  Virtual Visit Consent  Roy [name], you are scheduled for a virtual visit with a [place] provider today. Just as with appointments in the office, your consent must be obtained to participate. Your consent will be active for this visit and any virtual visit you may have with one of our providers in the next 365 days. If you have a MyChart account, a copy of this consent can be sent to you electronically.  By engaging in this virtual visit, you consent to the provision of healthcare and authorize for your insurance to be billed (if applicable) for the services provided during this visit. Depending on your insurance coverage, you may receive a charge related to this service.  I need to obtain your verbal consent now. Are you willing to proceed with your visit today? Roy [name] has provided verbal consent on 10/18/2023 for a virtual visit (via [employer]). Lauraine Kitty, FNP  Date: 10/18/2023 1:20 PM  Virtual Visit via Video Note   I, Lauraine Kitty, connected with  Roy [name]  ([id number], 1960-02-11) on 10/18/23 at  1:00 PM EDT by a video-enabled telemedicine application and verified that I am speaking with the correct person using two identifiers.  Telepresenter, Berlinda Pouch, present for entirety of visit to assist with video functionality and physical examination via [employer] device.   This is an initial visit to discuss the opportunity to become a primary care patient at [facility]  The patient understands that if their medical background is complex, their case will be reviewed with the Medical Director, and if Virtual [facility] is not the ideal location for their care, our team will help establish the patient with a primary care provider in the area.   If this is determined that this location is not the best option for the patient, in the future if the patient's medical condition changes we can re explore the option of this location  serving as their primary care location.  The patient understands that by becoming a primary care patient, this would be the location for their primary care, and if they chose to leave this location and seek primary care services at another location they will not be able to continue their relationship with this clinic.    Location: Patient: Virtual Visit Location Patient: [facility] visit location: [facility]  Provider: Virtual Visit Location Provider: Home Office   History of Present Illness: Roy [name] is a 63 y.o. who identifies as a male who was assigned male at birth, and is being seen today as a new patient with the [facility].  He has been a patient at the [facility] clinic in [place] in the past. 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 Yes Have you seen a Primary Care  Provider in the past? Yes Do you live in [facility]? No Do you live in the [facility]? No  Do you have a history of Diabetes No Do you have a history of HTN Yes Do you have a history of high cholesterol? No Have you been diagnosed with kidney disease or kidney failure in the past? Yes Have you been diagnosed with an autoimmune disease? No Have you been diagnosed with sleep apnea? No  He has been scheduled in the past but has not completed testing  Patient does  snore   Have you been diagnosed with Asthma or COPD? No Do you have a history of tobacco use? No Have you ever suffered a stroke or TIA? No Have you ever had a seizure? No Have you been diagnosed with ADHD? No Have you been diagnosed with anxiety or depression? No  Have you had an organ transplant in the past No  Do you have any prescriptions for controlled medications No  Do you see any specialists currently Yes  Have you had surgery in the past Yes  Surgical history: 2005 left hip surgery  2019 surgery to remove implant due to infection  2020 new left hip   Cataract surgery right eye   Do you take any hormone replacement therapy No    Problems:  Patient Active Problem List   Diagnosis Date Noted   Abscess of left thigh 02/27/2017    Allergies: No Known Allergies Medications:  Current Outpatient Medications:    amLODipine  (NORVASC ) 10 MG tablet, Take 1 tablet (10 mg total) by mouth daily. (Patient taking differently: Take 10 mg by mouth every morning.), Disp: 30 tablet, Rfl: 0   aspirin EC 81 MG tablet, Take 81 mg by mouth daily. Swallow whole., Disp: , Rfl:    carvedilol (COREG) 12.5 MG tablet, Take 12.5 mg by mouth 2 (two) times daily with a meal., Disp: , Rfl:    dicyclomine  (BENTYL ) 20 MG tablet, Take 1 tablet (20 mg total) by mouth every 6 (six) hours as needed for spasms., Disp: 30 tablet, Rfl: 1   doxazosin (CARDURA) 4 MG tablet, Take by mouth., Disp: , Rfl:    Multiple Vitamin (MULTIVITAMIN ADULT PO), Take by mouth., Disp: , Rfl:    Omega-3 Fatty Acids (FISH OIL PO), Take by mouth. (Patient not taking: Reported on 06/01/2021), Disp: , Rfl:    pantoprazole (PROTONIX) 40 MG tablet, Take 40 mg by mouth daily. , Disp: , Rfl:    telmisartan-hydrochlorothiazide  (MICARDIS HCT) 40-12.5 MG tablet, Take 1 tablet by mouth every morning., Disp: , Rfl:   Observations/Objective: Physical Exam Constitutional:      Appearance: Normal appearance.  HENT:     Head:  Normocephalic.     Nose: Nose normal.     Mouth/Throat:     Mouth: Mucous membranes are moist.  Pulmonary:     Effort: Pulmonary effort is normal.  Neurological:     Mental Status: He is alert and oriented to person, place, and time.  Psychiatric:        Mood and Affect: Mood normal.     Today's Vitals   10/18/23 1319 10/18/23 1334  BP: (!) 161/102 (!) 156/101  Pulse: 80   Resp: 16   SpO2: 97%   Weight: 280 lb (127 kg)   Height: 6' (1.829 m)    Body mass index is 37.97 kg/m.   Assessment and Plan:  1. Hypertension, unspecified type (Primary) - Hgb A1c w/o eAG - CBC with Differential/Platelet -  Comprehensive metabolic panel with GFR - Lipid panel - PSA - TSH - amLODipine  (NORVASC ) 10 MG tablet; Take 1 tablet (10 mg total) by mouth daily.  Dispense: 30 tablet; Refill: 3  2. Chronic kidney disease, unspecified CKD stage - Hgb A1c w/o eAG - CBC with Differential/Platelet - Comprehensive metabolic panel with GFR - Lipid panel - PSA - TSH  3. Screening examination for sexually transmitted disease - Hepatitis C antibody    Follow up in one week with BP checks at home  Continue to monitor BP daily  Restart Norvasc   Low salt diet  Labs today     Follow Up Instructions: I discussed the assessment and treatment plan with the patient. The Telepresenter provided patient with a physical copy of my written instructions for review.   The patient was advised to call back or seek an in-person evaluation if the symptoms worsen or if the condition fails to improve as anticipated.    Lauraine Kitty, FNP  **Disclaimer: This note may have been dictated with voice recognition software. Similar sounding words can inadvertently be transcribed and this note may contain transcription errors which may not have been corrected upon publication of note.**

## 2023-10-19 ENCOUNTER — Ambulatory Visit: Payer: Self-pay | Admitting: Nurse Practitioner

## 2023-10-19 LAB — COMPREHENSIVE METABOLIC PANEL WITH GFR
ALT: 18 IU/L (ref 0–44)
AST: 21 IU/L (ref 0–40)
Albumin: 4.5 g/dL (ref 3.9–4.9)
Alkaline Phosphatase: 115 IU/L (ref 47–123)
BUN/Creatinine Ratio: 17 (ref 10–24)
BUN: 21 mg/dL (ref 8–27)
Bilirubin Total: 0.5 mg/dL (ref 0.0–1.2)
CO2: 23 mmol/L (ref 20–29)
Calcium: 10 mg/dL (ref 8.6–10.2)
Chloride: 96 mmol/L (ref 96–106)
Creatinine, Ser: 1.25 mg/dL (ref 0.76–1.27)
Globulin, Total: 3 g/dL (ref 1.5–4.5)
Glucose: 108 mg/dL — ABNORMAL HIGH (ref 70–99)
Potassium: 4.3 mmol/L (ref 3.5–5.2)
Sodium: 137 mmol/L (ref 134–144)
Total Protein: 7.5 g/dL (ref 6.0–8.5)
eGFR: 65 mL/min/1.73 (ref >59)

## 2023-10-19 LAB — CBC WITH DIFFERENTIAL/PLATELET
Basophils Absolute: 0 x10E3/uL (ref 0.0–0.2)
Basos: 1 %
EOS (ABSOLUTE): 0.1 x10E3/uL (ref 0.0–0.4)
Eos: 1 %
Hematocrit: 46.5 % (ref 37.5–51.0)
Hemoglobin: 15 g/dL (ref 13.0–17.7)
Immature Grans (Abs): 0 x10E3/uL (ref 0.0–0.1)
Immature Granulocytes: 0 %
Lymphocytes Absolute: 1.9 x10E3/uL (ref 0.7–3.1)
Lymphs: 25 %
MCH: 31.2 pg (ref 26.6–33.0)
MCHC: 32.3 g/dL (ref 31.5–35.7)
MCV: 97 fL (ref 79–97)
Monocytes Absolute: 0.8 x10E3/uL (ref 0.1–0.9)
Monocytes: 10 %
Neutrophils Absolute: 4.8 x10E3/uL (ref 1.4–7.0)
Neutrophils: 63 %
Platelets: 185 x10E3/uL (ref 150–450)
RBC: 4.81 x10E6/uL (ref 4.14–5.80)
RDW: 12.9 % (ref 11.6–15.4)
WBC: 7.7 x10E3/uL (ref 3.4–10.8)

## 2023-10-19 LAB — LIPID PANEL
Chol/HDL Ratio: 3.7 ratio (ref 0.0–5.0)
Cholesterol, Total: 185 mg/dL (ref 100–199)
HDL: 50 mg/dL (ref >39)
LDL Chol Calc (NIH): 116 mg/dL — ABNORMAL HIGH (ref 0–99)
Triglycerides: 108 mg/dL (ref 0–149)
VLDL Cholesterol Cal: 19 mg/dL (ref 5–40)

## 2023-10-19 LAB — TSH: TSH: 1.66 u[IU]/mL (ref 0.450–4.500)

## 2023-10-19 LAB — PSA: Prostate Specific Ag, Serum: 0.9 ng/mL (ref 0.0–4.0)

## 2023-10-19 LAB — HEPATITIS C ANTIBODY: Hep C Virus Ab: NONREACTIVE

## 2023-10-19 LAB — HGB A1C W/O EAG: Hgb A1c MFr Bld: 5.7 % — ABNORMAL HIGH (ref 4.8–5.6)

## 2023-10-25 ENCOUNTER — Telehealth: Admitting: Nurse Practitioner

## 2023-10-25 DIAGNOSIS — R030 Elevated blood-pressure reading, without diagnosis of hypertension: Secondary | ICD-10-CM

## 2023-10-25 DIAGNOSIS — Z1211 Encounter for screening for malignant neoplasm of colon: Secondary | ICD-10-CM

## 2023-10-25 NOTE — Progress Notes (Signed)
 Acute Video Visit    Virtual Visit Consent:   Roy [name], you are scheduled for a virtual visit with a [place] provider today.     Just as with appointments in the office, your consent must be obtained to participate.  Your consent will be active for this visit and any virtual visit you may have with one of our providers in the next 365 days.     If you have a MyChart account, a copy of this consent can be sent to you electronically.  All virtual visits are billed to your insurance company just like a traditional visit in the office.    If the connection with a video visit is poor, the visit may have to be switched to a telephone visit.  With either a video or telephone visit, we are not always able to ensure that we have a secure connection.     I need to obtain your verbal consent now.   Are you willing to proceed with your visit today?    Roy [name] has provided verbal consent on 10/25/2023 for a virtual visit (video or telephone).   Lauraine Kitty, FNP  Date: 10/25/2023 12:57 PM  Subjective:     Patient ID: Roy [name], male    DOB: [birth year], 63 y.o.   MRN: [id number]  Roy [name] Lauraine Kitty, connected with  Roy [name]  ([id number], 1961/01/22) on 10/25/23 at  1:00 PM EDT by a video-enabled telemedicine application and verified that I am speaking with the correct person using two identifiers.   Location: Patient: Home  Provider: Virtual Visit Location Provider: Home    HPI  Roy [name] is a 63 y.o. who identifies as a male who was assigned male at birth, and is being seen today for follow up with blood pressure after restarting Norvasc  one week ago.   Blood pressure readings from past 3 days  119/79 Pulse 78  111/65 (yesterday)   Discussed lab results from last week. Denies soda/sweet tea. Denies a heavy intake of sugar/sweets. He has cut back on his bread intake   He has had some symptoms of lightheadedness when standing up quickly in the am.   Aside  from that he denies any SE   On review of care gaps today it was noted that he is overdue for one year colonoscopy needed 07/2022.         Objective:      Physical Exam Constitutional:      Appearance: Normal appearance. He is not ill-appearing.  HENT:     Head: Normocephalic.  Pulmonary:     Effort: Pulmonary effort is normal.  Neurological:     Mental Status: He is alert and oriented to person, place, and time.  Psychiatric:        Mood and Affect: Mood normal.      Labs reviewed today  Recent Results (from the past 2160 hours)  CBG     Status: None   Collection Time: 08/20/23 12:00 AM  Result Value Ref Range   Glucose 107   Hgb A1c w/o eAG     Status: Abnormal   Collection Time: 10/18/23  1:50 PM  Result Value Ref Range   Hgb A1c MFr Bld 5.7 (H) 4.8 - 5.6 %    Comment:          Prediabetes: 5.7 - 6.4          Diabetes: >6.4  Glycemic control for adults with diabetes: <7.0   CBC with Differential/Platelet     Status: None   Collection Time: 10/18/23  1:50 PM  Result Value Ref Range   WBC 7.7 3.4 - 10.8 x10E3/uL   RBC 4.81 4.14 - 5.80 x10E6/uL   Hemoglobin 15.0 13.0 - 17.7 g/dL   Hematocrit 53.4 62.4 - 51.0 %   MCV 97 79 - 97 fL   MCH 31.2 26.6 - 33.0 pg   MCHC 32.3 31.5 - 35.7 g/dL   RDW 87.0 88.3 - 84.5 %   Platelets 185 150 - 450 x10E3/uL   Neutrophils 63 Not Estab. %   Lymphs 25 Not Estab. %   Monocytes 10 Not Estab. %   Eos 1 Not Estab. %   Basos 1 Not Estab. %   Neutrophils Absolute 4.8 1.4 - 7.0 x10E3/uL   Lymphocytes Absolute 1.9 0.7 - 3.1 x10E3/uL   Monocytes Absolute 0.8 0.1 - 0.9 x10E3/uL   EOS (ABSOLUTE) 0.1 0.0 - 0.4 x10E3/uL   Basophils Absolute 0.0 0.0 - 0.2 x10E3/uL   Immature Granulocytes 0 Not Estab. %   Immature Grans (Abs) 0.0 0.0 - 0.1 x10E3/uL  Comprehensive metabolic panel with GFR     Status: Abnormal   Collection Time: 10/18/23  1:50 PM  Result Value Ref Range   Glucose 108 (H) 70 - 99 mg/dL   BUN 21 8 - 27 mg/dL    Creatinine, Ser 8.74 0.76 - 1.27 mg/dL   eGFR 65 >40 fO/fpw/8.26   BUN/Creatinine Ratio 17 10 - 24   Sodium 137 134 - 144 mmol/L   Potassium 4.3 3.5 - 5.2 mmol/L   Chloride 96 96 - 106 mmol/L   CO2 23 20 - 29 mmol/L   Calcium 10.0 8.6 - 10.2 mg/dL   Total Protein 7.5 6.0 - 8.5 g/dL   Albumin 4.5 3.9 - 4.9 g/dL   Globulin, Total 3.0 1.5 - 4.5 g/dL   Bilirubin Total 0.5 0.0 - 1.2 mg/dL   Alkaline Phosphatase 115 47 - 123 IU/L    Comment:               **Please note reference interval change**   AST 21 0 - 40 IU/L   ALT 18 0 - 44 IU/L  Lipid panel     Status: Abnormal   Collection Time: 10/18/23  1:50 PM  Result Value Ref Range   Cholesterol, Total 185 100 - 199 mg/dL   Triglycerides 891 0 - 149 mg/dL   HDL 50 >60 mg/dL   VLDL Cholesterol Cal 19 5 - 40 mg/dL   LDL Chol Calc (NIH) 883 (H) 0 - 99 mg/dL   Chol/HDL Ratio 3.7 0.0 - 5.0 ratio    Comment:                                   T. Chol/HDL Ratio                                             Men  Women                               1/2 Avg.Risk  3.4    3.3  Avg.Risk  5.0    4.4                                2X Avg.Risk  9.6    7.1                                3X Avg.Risk 23.4   11.0   PSA     Status: None   Collection Time: 10/18/23  1:50 PM  Result Value Ref Range   Prostate Specific Ag, Serum 0.9 0.0 - 4.0 ng/mL    Comment: Roche ECLIA methodology. According to the American Urological Association, Serum PSA should decrease and remain at undetectable levels after radical prostatectomy. The AUA defines biochemical recurrence as an initial PSA value 0.2 ng/mL or greater followed by a subsequent confirmatory PSA value 0.2 ng/mL or greater. Values obtained with different assay methods or kits cannot be used interchangeably. Results cannot be interpreted as absolute evidence of the presence or absence of malignant disease.   TSH     Status: None   Collection Time: 10/18/23  1:50 PM   Result Value Ref Range   TSH 1.660 0.450 - 4.500 uIU/mL  Hepatitis C antibody     Status: None   Collection Time: 10/18/23  1:50 PM  Result Value Ref Range   Hep C Virus Ab Non Reactive Non Reactive    Comment: HCV antibody alone does not differentiate between previously resolved infection and active infection. Equivocal and Reactive HCV antibody results should be followed up with an HCV RNA test to support the diagnosis of active HCV infection.          Assessment & Plan:    1. Colon cancer screening (Primary)  - Ambulatory referral to Gastroenterology  2. Elevated blood pressure reading Follow up in one week with additional blood pressure readings   Stand up slowly, report to provider if blood pressure readings are under 110/60 or with any new SE  Address phq9 at next visit   Follow Up Instructions: I discussed the assessment and treatment plan with the patient. The patient was provided an opportunity to ask questions and all were answered. The patient agreed with the plan and demonstrated an understanding of the instructions.  A copy of instructions were sent to the patient via MyChart unless otherwise noted below.    The patient was advised to call back or seek an in-person evaluation if the symptoms worsen or if the condition fails to improve as anticipated.    Lauraine Kitty, FNP  **Disclaimer: This note may have been dictated with voice recognition software. Similar sounding words can inadvertently be transcribed and this note may contain transcription errors which may not have been corrected upon publication of note.**

## 2023-11-01 ENCOUNTER — Telehealth: Admitting: Nurse Practitioner

## 2023-11-01 VITALS — BP 118/78

## 2023-11-01 DIAGNOSIS — I1A0 Resistant hypertension: Secondary | ICD-10-CM | POA: Diagnosis not present

## 2023-11-01 NOTE — Progress Notes (Signed)
 Acute Video Visit    Virtual Visit Consent:   Roy [name], you are scheduled for a virtual visit with a [place] provider today.     Just as with appointments in the office, your consent must be obtained to participate.  Your consent will be active for this visit and any virtual visit you may have with one of our providers in the next 365 days.     If you have a MyChart account, a copy of this consent can be sent to you electronically.  All virtual visits are billed to your insurance company just like a traditional visit in the office.    If the connection with a video visit is poor, the visit may have to be switched to a telephone visit.  With either a video or telephone visit, we are not always able to ensure that we have a secure connection.     I need to obtain your verbal consent now.   Are you willing to proceed with your visit today?    Roy [name] has provided verbal consent on 11/01/2023 for a virtual visit (video or telephone).   Lauraine Kitty, FNP  Date: 11/01/2023 1:10 PM  Subjective:     Patient ID: Roy [name], male    DOB: 11/03/1960, 63 y.o.   MRN: [id number]  LILLETTE Lauraine Kitty, connected with  CHADWIN FURY  ([id number], [birth year]) on 11/01/23 at  1:00 PM EDT by a audio only-enabled telemedicine application and verified that I am speaking with the correct person using two identifiers. In this encounter patient was unable to get his camera to turn on    Location: Patient: Home  Provider: Virtual Visit Location Provider: Home Office   I discussed the limitations of evaluation and management by telemedicine and the availability of in person appointments. The patient expressed understanding and agreed to proceed.     HPI  Roy [name] is a 63 y.o. who identifies as a male who was assigned male at birth, and is being seen today for blood pressure follow up. He is being bridged by Virtual Primary Care while awaiting a new PCP in [place] area.   History  significant for hypertension previously managed on mutidrug therapy without evidence of full cardiovascular workup   He was restarted on his Norvasc  10/18/2023 at his initial appointment with practice  Since that time his blood pressure readings have significantly improved   Denies lightheaded episodes or any dizziness in the past week  Denies any swelling in his feet / ankles    Blood pressure readings from home: Ranges from 103-140/ 65-99  Typically gets his highest blood pressure reading first thing in the morning prior to medications.  Lowest reading is typically in the afternoon   He feels that when he is on all of his HTN medications he has a harder time with erection.       Objective:     10/25/2023 BP reading was 119/79 at home  10/24/2023 111/65''   BP 118/78  BP Readings from Last 3 Encounters:  11/01/23 118/78  10/18/23 (!) 156/101  10/15/23 (!) 155/90      Physical Exam Pulmonary:     Effort: Pulmonary effort is normal.  Neurological:     Mental Status: He is alert and oriented to person, place, and time.  Psychiatric:        Mood and Affect: Mood normal.         11/01/2023    1:07  PM 10/18/2023    1:37 PM  PHQ9 SCORE ONLY  PHQ-9 Total Score 2 11    Discussed score with patient today offered counseling support or additional resources patient declined     Assessment & Plan:   1. Resistant hypertension (Primary) - Ambulatory referral to Cardiology   Discussed cardiology referral for persistent HTN and workup while awaiting new PCP appointment  Patient is agreeable. Phq9 improved at today's visit and patient declines need for counseling or other referrals at this time     Follow Up Instructions: I discussed the assessment and treatment plan with the patient. The patient was provided an opportunity to ask questions and all were answered. The patient agreed with the plan and demonstrated an understanding of the instructions.  A copy of instructions  were sent to the patient via MyChart unless otherwise noted below.    The patient was advised to call back or seek an in-person evaluation if the symptoms worsen or if the condition fails to improve as anticipated.    Lauraine Kitty, FNP  **Disclaimer: This note may have been dictated with voice recognition software. Similar sounding words can inadvertently be transcribed and this note may contain transcription errors which may not have been corrected upon publication of note.**

## 2023-12-05 NOTE — Progress Notes (Signed)
 The patient attended a screening event on 10/15/2023 where her BP screening results was 155/90. At the event the patient noted he has [employer] and does not smoke. Patient did not have any SDOH insecurities at the screening event. Pt listed pcp as Dr. Kennieth Leech. Per chart review pt has a pcp and the last office visit was on 02/01/2023 with pcp, there is no notes visible for the visit in [facility]. Per chart review pt was seen by Lauraine Kitty at the [facility]pc through a referral by pcp on 10/18/2023. On 10/18/2023 pt BP was 156/101. During the appointment, the patient reported a food SDOH concern. As a precaution, the patient was provided with [facility] food resources via [employer] via email by the [facility]W at the vpc 10/18/2023. Chart review also indicted pt was seen by Lauraine Kitty, FNP on 10/25/2023 for a video visit. According to chart review pt is currently on amlodipine  to manage BP. Chart review also indicates a future appt on 02/07/2024 . No additional [facility] [employer] team support indicated at this time.

## 2023-12-26 ENCOUNTER — Ambulatory Visit: Attending: Internal Medicine | Admitting: Internal Medicine

## 2023-12-26 ENCOUNTER — Encounter: Payer: Self-pay | Admitting: Internal Medicine

## 2023-12-26 VITALS — BP 112/63 | HR 81 | Ht 73.0 in | Wt 280.0 lb

## 2023-12-26 DIAGNOSIS — E7800 Pure hypercholesterolemia, unspecified: Secondary | ICD-10-CM

## 2023-12-26 DIAGNOSIS — I1A0 Resistant hypertension: Secondary | ICD-10-CM | POA: Diagnosis not present

## 2023-12-26 DIAGNOSIS — E669 Obesity, unspecified: Secondary | ICD-10-CM

## 2023-12-26 NOTE — Patient Instructions (Signed)
 Medication Instructions:   NO CHANGES  Testing/Procedures: Dr. [name] suggested a coronary calcium score. If you are interested, we can get this ordered. It is $99 out of pocket.   Follow-Up: At [facility], you and your health needs are our priority.  As part of our continuing mission to provide you with exceptional heart care, our providers are all part of one team.  This team includes your primary Cardiologist (physician) and Advanced Practice Providers or APPs (Physician Assistants and Nurse Practitioners) who all work together to provide you with the care you need, when you need it.  Your next appointment:    AS NEEDED with Dr. [name]

## 2023-12-26 NOTE — Progress Notes (Signed)
 OFFICE CONSULT NOTE  Chief Complaint:  Resistant hypertension  Primary Care Physician: Kennyth Domino, FNP  HPI:  Roy [name] is a 63 y.o. male who is being seen today for the evaluation of resistant hypertension at the request of Kennyth Domino, FNP.  This is a pleasant 63 year old male kindly referred for evaluation management of resistant hypertension.  In the past his blood pressure has been difficult to control.  Recently he was placed on higher dose amlodipine  in addition to twice daily carvedilol and telmisartan/HCTZ.  He has occasionally had elevated blood pressures but is actually also noted low blood pressures at home.  1 day he almost passed out and noted that his diastolic blood pressure was 40.  Subsequently he adjusted his medicine timing to which she is taking the amlodipine  at night, the telmisartan HCTZ in the morning and carvedilol twice daily.  Since he has been on this combination his blood pressure has been well-controlled and he has not had any other presyncopal events.  Today's blood pressure was 112/63.  Hypertension does run in the family.  He did have lipid testing in September showing total cholesterol 185, triglycerides 108, HDL 50 and LDL 116.  He does note some increase in dietary saturated fats and is trying to work on weight loss.  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  PMHx:  Past Medical History:  Diagnosis Date   Abscess 02/27/2017   LEFT THIGH   Hypertension     Past Surgical History:  Procedure Laterality Date   CATARACT EXTRACTION Right    ELBOW SURGERY     HIP SURGERY Bilateral    replacement   IRRIGATION AND DEBRIDEMENT ABSCESS Left 02/28/2017   Procedure: IRRIGATION AND DEBRIDEMENT ABSCESS/THIGH;  Surgeon: Kinsinger, Herlene Righter, MD;  Location: [facility] OR;  Service: General;  Laterality: Left;   JOINT REPLACEMENT      FAMHx:  Family History  Problem Relation Age of Onset   Diabetes Father     Prostate cancer Paternal Uncle    Colon polyps Paternal Uncle    Colon cancer Neg Hx    Stomach cancer Neg Hx    Esophageal cancer Neg Hx    Pancreatic cancer Neg Hx     SOCHx:   reports that he has never smoked. He has never been exposed to tobacco smoke. He has never used smokeless tobacco. He reports current alcohol use of about 2.0 standard drinks of alcohol per week. He reports that he does not use drugs.  ALLERGIES:  No Known Allergies  ROS: Pertinent items noted in HPI and remainder of comprehensive ROS otherwise negative.  HOME MEDS: Current Outpatient Medications on File Prior to Visit  Medication Sig Dispense Refill   amLODipine  (NORVASC ) 10 MG tablet Take 1 tablet (10 mg total) by mouth daily. 30 tablet 3   amoxicillin  (AMOXIL ) 500 MG capsule Take 500 mg by mouth as needed (for Dental procedures).     aspirin EC 81 MG tablet Take 81 mg by mouth daily. Swallow whole.     carvedilol (COREG) 12.5 MG tablet Take 12.5 mg by mouth 2 (two) times daily with a meal.     Multiple Vitamin (MULTIVITAMIN ADULT PO) Take by mouth.     Omega-3 Fatty Acids (FISH OIL PO) Take by mouth.     telmisartan-hydrochlorothiazide  (MICARDIS HCT) 40-12.5 MG tablet Take 1 tablet by mouth every morning.     No current facility-administered medications  on file prior to visit.    LABS/IMAGING: No results found for this or any previous visit (from the past 48 hours). No results found.  LIPID PANEL:    Component Value Date/Time   CHOL 185 10/18/2023 1350   TRIG 108 10/18/2023 1350   HDL 50 10/18/2023 1350   CHOLHDL 3.7 10/18/2023 1350   CHOLHDL 4.4 02/27/2017 1811   VLDL 24 02/27/2017 1811   LDLCALC 116 (H) 10/18/2023 1350    No results found for: LIPOA    WEIGHTS: Wt Readings from Last 3 Encounters:  12/26/23 280 lb (127 kg)  10/18/23 280 lb (127 kg)  07/05/21 (!) 315 lb (142.9 kg)    VITALS: BP 112/63   Pulse 81   Ht 6' 1 (1.854 m)   Wt 280 lb (127 kg)   SpO2 98%   BMI  36.94 kg/m   EXAM: General appearance: alert and no distress Lungs: clear to auscultation bilaterally Heart: regular rate and rhythm, S1, S2 normal, no murmur, click, rub or gallop Extremities: extremities normal, atraumatic, no cyanosis or edema Neurologic: Grossly normal  EKG: EKG Interpretation Date/Time:  Wednesday December 26 2023 14:14:31 EST Ventricular Rate:  81 PR Interval:  196 QRS Duration:  88 QT Interval:  352 QTC Calculation: 408 R Axis:   26  Text Interpretation: Normal sinus rhythm T wave abnormality, consider lateral ischemia When compared with ECG of 15-Jul-2018 15:00, No significant change since last tracing Confirmed by [name] Kent [id number]) on 12/26/2023 2:19:11 PM  - personally reviewed  ASSESSMENT: Resistant hypertension Moderate obesity Dyslipidemia  PLAN: 1.   Mr.  Kober has had some resistant hypertension, however blood pressure today was actually well-controlled and he says at home it seems to be better and more evenly regulated.  Part of his issue may have been that he was not drinking a lot of water and he is on a diuretic.  He has increased that and moved his amlodipine  dose to take at night.  I would recommend continuing his current medication combination.  His LDL was a little generous but might improve with dietary restriction of saturated fats and increasing exercise and weight loss.  I recommended a calcium score to better risk stratify him.  If it is 0 I think we can continue to work on lifestyle modifications.  If there is however coronary calcification then I would suggest both lifestyle and weight loss changes as well as possibly low-dose statin therapy to reach a goal LDL less than 70.  Thanks again for the kind referral.  Follow-up with me as needed.  Kent KYM [name], MD, [facility], FNLA, FACP  [facility]  Medical Director of the Advanced Lipid Disorders &  Cardiovascular Risk Reduction Clinic Diplomate of the American  Board of Clinical Lipidology Attending Cardiologist  Direct Dial: [phone]  Fax: [phone]  Website:  [email or web address]  Kent BROCKS Xochil Shanker 12/26/2023, 2:19 PM

## 2024-01-01 ENCOUNTER — Other Ambulatory Visit: Payer: Self-pay | Admitting: Nurse Practitioner

## 2024-01-01 DIAGNOSIS — I1A0 Resistant hypertension: Secondary | ICD-10-CM

## 2024-01-01 DIAGNOSIS — E7849 Other hyperlipidemia: Secondary | ICD-10-CM

## 2024-01-07 ENCOUNTER — Telehealth: Payer: Self-pay

## 2024-01-07 NOTE — Telephone Encounter (Signed)
 Spoke w/patient adv of scheduled CT test for 12/11

## 2024-01-08 NOTE — Progress Notes (Unsigned)
 Pt attended 11/22/2023 screening event with BP of 135/101.   Per initial f/u pt was reached out via phone and was left vm. Pt was mailed letter with food resources, BP management flyer, BP log, reduced sodium alternatives flyer, [employer] flyer, & [place] [employer] card.  Per chart review pt does have a PCP Teddi Kitty; [facility]; [facility]), insurance, and is not a smoker.Pt's last appt with PCP Teddi Kitty) was 11/01/2023 and has an upcoming appt on 02/07/2023. Pt got vitals taken at [facility] on 01/11/2024. Pt does indicate food SDOH needs at this time.  Additional pt f/u to be scheduled at this time per health equity protocol.

## 2024-01-10 ENCOUNTER — Ambulatory Visit (HOSPITAL_COMMUNITY): Admission: RE | Admit: 2024-01-10 | Discharge: 2024-01-10 | Attending: Nurse Practitioner

## 2024-01-10 DIAGNOSIS — I1A0 Resistant hypertension: Secondary | ICD-10-CM

## 2024-01-10 DIAGNOSIS — E7849 Other hyperlipidemia: Secondary | ICD-10-CM

## 2024-01-10 MED ORDER — NITROGLYCERIN 0.4 MG SL SUBL: 0.8000 mg | SUBLINGUAL_TABLET | Freq: Once | SUBLINGUAL | Status: DC

## 2024-01-11 ENCOUNTER — Telehealth: Payer: Self-pay | Admitting: Nurse Practitioner

## 2024-01-11 DIAGNOSIS — E7800 Pure hypercholesterolemia, unspecified: Secondary | ICD-10-CM

## 2024-01-11 DIAGNOSIS — I10 Essential (primary) hypertension: Secondary | ICD-10-CM

## 2024-01-11 MED ORDER — PRAVASTATIN SODIUM 10 MG PO TABS: 10.0000 mg | ORAL_TABLET | Freq: Every day | ORAL | 6 refills | Status: DC

## 2024-01-11 NOTE — Telephone Encounter (Addendum)
 Attempted to reach patient to discuss Calcium  scoring

## 2024-01-12 ENCOUNTER — Other Ambulatory Visit: Payer: Self-pay | Admitting: Nurse Practitioner

## 2024-01-12 DIAGNOSIS — I10 Essential (primary) hypertension: Secondary | ICD-10-CM

## 2024-01-13 ENCOUNTER — Ambulatory Visit (HOSPITAL_BASED_OUTPATIENT_CLINIC_OR_DEPARTMENT_OTHER): Payer: Self-pay | Admitting: Internal Medicine

## 2024-01-13 DIAGNOSIS — I1A0 Resistant hypertension: Secondary | ICD-10-CM

## 2024-01-13 DIAGNOSIS — E7800 Pure hypercholesterolemia, unspecified: Secondary | ICD-10-CM

## 2024-01-13 DIAGNOSIS — I2510 Atherosclerotic heart disease of native coronary artery without angina pectoris: Secondary | ICD-10-CM

## 2024-01-14 MED ORDER — ROSUVASTATIN CALCIUM 20 MG PO TABS: 20.0000 mg | ORAL_TABLET | Freq: Every day | ORAL | 3 refills | Status: AC

## 2024-01-15 ENCOUNTER — Encounter: Payer: Self-pay | Admitting: Gastroenterology

## 2024-01-22 ENCOUNTER — Encounter

## 2024-01-22 VITALS — Ht 73.0 in | Wt 270.0 lb

## 2024-01-22 DIAGNOSIS — Z1211 Encounter for screening for malignant neoplasm of colon: Secondary | ICD-10-CM

## 2024-01-22 MED ORDER — NA SULFATE-K SULFATE-MG SULF 17.5-3.13-1.6 GM/177ML PO SOLN: 1.0000 | Freq: Once | ORAL | 0 refills | Status: AC

## 2024-01-22 NOTE — Progress Notes (Signed)
 No egg or soy allergy known to patient  No issues known to pt with past sedation with any surgeries or procedures Patient denies ever being told they had issues or difficulty with intubation  No FH of Malignant Hyperthermia Pt is not on diet pills Pt is not on  home 02  Pt is not on blood thinners  Pt denies issues with constipation  No A fib or A flutter Have any cardiac testing pending-- CT Cardiac on 01/23/2024- checking for blockages per pt.- Denies any chest pain.   Pt can ambulate--independently  Pt denies use of chewing tobacco Discussed diabetic I weight loss medication holds Discussed NSAID holds Checked BMI Pt instructed to use Singlecare.com or GoodRx for a price reduction on prep  Patient's chart reviewed by Norleen Schillings CNRA prior to previsit and patient appropriate for the [facility].  Pre visit completed and red dot placed by patient's name on their procedure day (on provider's schedule).

## 2024-01-23 ENCOUNTER — Ambulatory Visit (HOSPITAL_BASED_OUTPATIENT_CLINIC_OR_DEPARTMENT_OTHER)
Admission: RE | Admit: 2024-01-23 | Discharge: 2024-01-23 | Disposition: A | Source: Ambulatory Visit | Attending: Internal Medicine

## 2024-01-23 ENCOUNTER — Other Ambulatory Visit: Payer: Self-pay | Admitting: Internal Medicine

## 2024-01-23 ENCOUNTER — Ambulatory Visit (HOSPITAL_COMMUNITY)
Admission: RE | Admit: 2024-01-23 | Discharge: 2024-01-23 | Disposition: A | Discharge status: Home / Self Care | Source: Ambulatory Visit | Attending: Cardiovascular Disease | Admitting: Cardiovascular Disease

## 2024-01-23 DIAGNOSIS — R931 Abnormal findings on diagnostic imaging of heart and coronary circulation: Secondary | ICD-10-CM

## 2024-01-23 DIAGNOSIS — I2510 Atherosclerotic heart disease of native coronary artery without angina pectoris: Secondary | ICD-10-CM | POA: Insufficient documentation

## 2024-01-23 DIAGNOSIS — I1A0 Resistant hypertension: Secondary | ICD-10-CM | POA: Insufficient documentation

## 2024-01-23 DIAGNOSIS — I2584 Coronary atherosclerosis due to calcified coronary lesion: Secondary | ICD-10-CM

## 2024-01-23 LAB — POCT I-STAT CREATININE: Creatinine, Ser: 1.8 mg/dL — ABNORMAL HIGH (ref 0.61–1.24)

## 2024-01-23 MED ORDER — IOHEXOL 350 MG/ML SOLN
100.0000 mL | Freq: Once | INTRAVENOUS | Status: AC | PRN
  Administered 2024-01-23: 100 mL via INTRAVENOUS

## 2024-01-23 MED ORDER — NITROGLYCERIN 0.4 MG SL SUBL
0.8000 mg | SUBLINGUAL_TABLET | Freq: Once | SUBLINGUAL | Status: AC
  Administered 2024-01-23: 0.8 mg via SUBLINGUAL

## 2024-01-23 MED ORDER — METOPROLOL TARTRATE 5 MG/5ML IV SOLN
INTRAVENOUS | Status: AC
  Filled 2024-01-23: qty 5

## 2024-01-23 MED ORDER — METOPROLOL TARTRATE 5 MG/5ML IV SOLN
5.0000 mg | Freq: Once | INTRAVENOUS | Status: AC
  Administered 2024-01-23: 5 mg via INTRAVENOUS

## 2024-01-25 ENCOUNTER — Ambulatory Visit: Payer: Self-pay | Admitting: Internal Medicine

## 2024-01-25 NOTE — Telephone Encounter (Signed)
 Spoke with patient regarding CT results, Cardiology has already placed an order for Crestor - also scheduling Angiogram for 12/30 will follow up with patient after that

## 2024-01-28 NOTE — Progress Notes (Signed)
.  Declined SDOH

## 2024-01-28 NOTE — Telephone Encounter (Signed)
" ° °  Called and spoke with Roy [name] this evening to explain the heart cath procedure tomorrow. He was instructed to arrive at 11 am for a scheduled 1:30 procedure per Delon O'Neals instructions. I discussed risks, benefits and alternatives of the procedure and he is agreeable to proceed.  Informed Consent   Shared Decision Making/Informed Consent The risks [stroke (1 in 1000), death (1 in 1000), kidney failure [usually temporary] (1 in 500), bleeding (1 in 200), allergic reaction [possibly serious] (1 in 200)], benefits (diagnostic support and management of coronary artery disease) and alternatives of a cardiac catheterization were discussed in detail with Roy [name] and he is willing to proceed.    Vinie KYM Maxcy, MD, [facility], FNLA, FACP  [place]  [facility]  Medical Director of the Advanced Lipid Disorders &  Cardiovascular Risk Reduction Clinic Diplomate of the American Board of Clinical Lipidology Attending Cardiologist  Direct Dial: [phone]  Fax: [phone]  Website:  [email or web address]  "

## 2024-01-29 ENCOUNTER — Encounter (HOSPITAL_COMMUNITY)
Admission: RE | Disposition: A | Discharge status: Home / Self Care | Payer: Self-pay | Source: Home / Self Care | Attending: Cardiology

## 2024-01-29 ENCOUNTER — Ambulatory Visit (HOSPITAL_COMMUNITY)
Admission: RE | Admit: 2024-01-29 | Discharge: 2024-01-29 | Disposition: A | Discharge status: Home / Self Care | Attending: Cardiology | Admitting: Cardiology

## 2024-01-29 ENCOUNTER — Other Ambulatory Visit: Payer: Self-pay

## 2024-01-29 DIAGNOSIS — I25110 Atherosclerotic heart disease of native coronary artery with unstable angina pectoris: Secondary | ICD-10-CM | POA: Diagnosis not present

## 2024-01-29 DIAGNOSIS — I2584 Coronary atherosclerosis due to calcified coronary lesion: Secondary | ICD-10-CM | POA: Diagnosis not present

## 2024-01-29 DIAGNOSIS — I1A0 Resistant hypertension: Secondary | ICD-10-CM | POA: Diagnosis not present

## 2024-01-29 DIAGNOSIS — E785 Hyperlipidemia, unspecified: Secondary | ICD-10-CM | POA: Insufficient documentation

## 2024-01-29 DIAGNOSIS — I10 Essential (primary) hypertension: Secondary | ICD-10-CM | POA: Diagnosis not present

## 2024-01-29 DIAGNOSIS — E669 Obesity, unspecified: Secondary | ICD-10-CM | POA: Diagnosis not present

## 2024-01-29 DIAGNOSIS — Z7982 Long term (current) use of aspirin: Secondary | ICD-10-CM | POA: Diagnosis not present

## 2024-01-29 DIAGNOSIS — Z6835 Body mass index (BMI) 35.0-35.9, adult: Secondary | ICD-10-CM | POA: Insufficient documentation

## 2024-01-29 DIAGNOSIS — Z79899 Other long term (current) drug therapy: Secondary | ICD-10-CM | POA: Insufficient documentation

## 2024-01-29 DIAGNOSIS — I2510 Atherosclerotic heart disease of native coronary artery without angina pectoris: Secondary | ICD-10-CM | POA: Insufficient documentation

## 2024-01-29 DIAGNOSIS — I200 Unstable angina: Secondary | ICD-10-CM

## 2024-01-29 HISTORY — PX: LEFT HEART CATH AND CORONARY ANGIOGRAPHY: CATH118249

## 2024-01-29 LAB — CBC
HCT: 42.3 % (ref 39.0–52.0)
Hemoglobin: 14.1 g/dL (ref 13.0–17.0)
MCH: 31.1 pg (ref 26.0–34.0)
MCHC: 33.3 g/dL (ref 30.0–36.0)
MCV: 93.4 fL (ref 80.0–100.0)
Platelets: 221 K/uL (ref 150–400)
RBC: 4.53 MIL/uL (ref 4.22–5.81)
RDW: 12.7 % (ref 11.5–15.5)
WBC: 10.8 K/uL — ABNORMAL HIGH (ref 4.0–10.5)
nRBC: 0 % (ref 0.0–0.2)

## 2024-01-29 LAB — BASIC METABOLIC PANEL WITH GFR
Anion gap: 9 (ref 5–15)
BUN: 27 mg/dL — ABNORMAL HIGH (ref 8–23)
CO2: 29 mmol/L (ref 22–32)
Calcium: 9.3 mg/dL (ref 8.9–10.3)
Chloride: 100 mmol/L (ref 98–111)
Creatinine, Ser: 1.32 mg/dL — ABNORMAL HIGH (ref 0.61–1.24)
GFR, Estimated: >60 mL/min
Glucose, Bld: 105 mg/dL — ABNORMAL HIGH (ref 70–99)
Potassium: 4.3 mmol/L (ref 3.5–5.1)
Sodium: 138 mmol/L (ref 135–145)

## 2024-01-29 SURGERY — LEFT HEART CATH AND CORONARY ANGIOGRAPHY
Anesthesia: LOCAL

## 2024-01-29 MED ORDER — SODIUM CHLORIDE 0.9% FLUSH: 3.0000 mL | INTRAVENOUS | Status: DC | PRN

## 2024-01-29 MED ORDER — MIDAZOLAM HCL 2 MG/2ML IJ SOLN
INTRAMUSCULAR | Status: AC
  Filled 2024-01-29: qty 2

## 2024-01-29 MED ORDER — HEPARIN (PORCINE) IN NACL 1000-0.9 UT/500ML-% IV SOLN
INTRAVENOUS | Status: DC | PRN
  Administered 2024-01-29 (×2): 500 mL

## 2024-01-29 MED ORDER — FENTANYL CITRATE (PF) 100 MCG/2ML IJ SOLN
INTRAMUSCULAR | Status: AC
  Filled 2024-01-29: qty 2

## 2024-01-29 MED ORDER — MIDAZOLAM HCL (PF) 2 MG/2ML IJ SOLN
INTRAMUSCULAR | Status: DC | PRN
  Administered 2024-01-29: 2 mg via INTRAVENOUS

## 2024-01-29 MED ORDER — FENTANYL CITRATE (PF) 100 MCG/2ML IJ SOLN
INTRAMUSCULAR | Status: DC | PRN
  Administered 2024-01-29: 25 ug via INTRAVENOUS

## 2024-01-29 MED ORDER — SODIUM CHLORIDE 0.9 % IV SOLN: 250.0000 mL | INTRAVENOUS | Status: DC | PRN

## 2024-01-29 MED ORDER — FREE WATER: 500.0000 mL | Freq: Once | Status: DC

## 2024-01-29 MED ORDER — ACETAMINOPHEN 325 MG PO TABS: 650.0000 mg | ORAL_TABLET | ORAL | Status: DC | PRN

## 2024-01-29 MED ORDER — HEPARIN SODIUM (PORCINE) 1000 UNIT/ML IJ SOLN
INTRAMUSCULAR | Status: DC | PRN
  Administered 2024-01-29: 6000 [IU] via INTRAVENOUS

## 2024-01-29 MED ORDER — HYDRALAZINE HCL 20 MG/ML IJ SOLN: 10.0000 mg | INTRAMUSCULAR | Status: DC | PRN

## 2024-01-29 MED ORDER — VERAPAMIL HCL 2.5 MG/ML IV SOLN
INTRAVENOUS | Status: DC | PRN
  Administered 2024-01-29: 10 mL via INTRA_ARTERIAL

## 2024-01-29 MED ORDER — VERAPAMIL HCL 2.5 MG/ML IV SOLN
INTRAVENOUS | Status: AC
  Filled 2024-01-29: qty 2

## 2024-01-29 MED ORDER — HEPARIN SODIUM (PORCINE) 1000 UNIT/ML IJ SOLN
INTRAMUSCULAR | Status: AC
  Filled 2024-01-29: qty 10

## 2024-01-29 MED ORDER — ONDANSETRON HCL 4 MG/2ML IJ SOLN: 4.0000 mg | Freq: Four times a day (QID) | INTRAMUSCULAR | Status: DC | PRN

## 2024-01-29 MED ORDER — ASPIRIN 81 MG PO CHEW: 81.0000 mg | CHEWABLE_TABLET | ORAL | Status: DC

## 2024-01-29 MED ORDER — LABETALOL HCL 5 MG/ML IV SOLN: 10.0000 mg | INTRAVENOUS | Status: DC | PRN

## 2024-01-29 MED ORDER — FREE WATER
500.0000 mL | Freq: Once | Status: AC
  Administered 2024-01-29: 500 mL via ORAL

## 2024-01-29 MED ORDER — LIDOCAINE HCL (PF) 1 % IJ SOLN
INTRAMUSCULAR | Status: AC
  Filled 2024-01-29: qty 30

## 2024-01-29 MED ORDER — LIDOCAINE HCL (PF) 1 % IJ SOLN
INTRAMUSCULAR | Status: DC | PRN
  Administered 2024-01-29: 5 mL via INTRADERMAL

## 2024-01-29 MED ORDER — IOHEXOL 350 MG/ML SOLN
INTRAVENOUS | Status: DC | PRN
  Administered 2024-01-29: 60 mL

## 2024-01-29 MED ORDER — SODIUM CHLORIDE 0.9% FLUSH: 3.0000 mL | Freq: Two times a day (BID) | INTRAVENOUS | Status: DC

## 2024-01-29 SURGICAL SUPPLY — 9 items
CATH 5FR JL3.5 JR4 ANG PIG MP (CATHETERS) IMPLANT
DEVICE RAD COMP TR BAND LRG (VASCULAR PRODUCTS) IMPLANT
GLIDESHEATH SLEND SS 6F .021 (SHEATH) IMPLANT
GUIDEWIRE INQWIRE 1.5J.035X260 (WIRE) IMPLANT
KIT SINGLE USE MANIFOLD (KITS) IMPLANT
KIT SYRINGE INJ CVI SPIKEX1 (MISCELLANEOUS) IMPLANT
PACK CARDIAC CATHETERIZATION (CUSTOM PROCEDURE TRAY) ×1 IMPLANT
SET ATX-X65L (MISCELLANEOUS) IMPLANT
SHEATH PROBE COVER 6X72 (BAG) IMPLANT

## 2024-01-29 NOTE — Progress Notes (Signed)
 Discharge instructions reviewed with patient and son at bedside. Denies questions concerns. PT tolerated PO intake. Ambulated in the hallway, was able to void without difficulty. Incision site remains clean dry and intact. No s/s of complications. PT escorted from the unit via wheel chair to personal vehicle.

## 2024-01-29 NOTE — Discharge Instructions (Addendum)
 Radial Site Care The following information offers guidance on how to care for yourself after your procedure. Your health care provider may also give you more specific instructions. If you have problems or questions, contact your health care provider. What can I expect after the procedure? After the procedure, it is common to have bruising and tenderness in the incision area. Follow these instructions at home: Incision site care  Follow instructions from your health care provider about how to take care of your incision site. Make sure you: Wash your hands with soap and water for at least 20 seconds before and after you change your bandage (dressing). If soap and water are not available, use hand sanitizer. Remove your dressing in 24 hours. Leave stitches (sutures), skin glue, or adhesive strips in place. These skin closures may need to stay in place for 2 weeks or longer. If adhesive strip edges start to loosen and curl up, you may trim the loose edges. Do not remove adhesive strips completely unless your health care provider tells you to do that. Do not take baths, swim, or use a hot tub for at least 1 week. You may shower 24 hours after the procedure or as told by your health care provider. Remove the dressing and gently wash the incision area with plain soap and water. Pat the area dry with a clean towel. Do not rub the site. That could cause bleeding. Do not apply powder or lotion to the site. Check your incision site every day for signs of infection. Check for: Redness, swelling, or pain. Fluid or blood. Warmth. Pus or a bad smell. Activity For 24 hours after the procedure, or as directed by your health care provider: Do not flex or bend the affected arm. Do not push or pull heavy objects with the affected arm. Do not operate machinery or power tools. Do not drive. You should not drive yourself home from the hospital or clinic if you go home during that time period. You may drive 24  hours after the procedure unless your health care provider tells you not to. Do not lift anything that is heavier than 10 lb (4.5 kg), or the limit that you are told, until your health care provider says that it is safe. Return to your normal activities as told by your health care provider. Ask your health care provider what activities are safe for you and when you can return to work. If you were given a sedative during the procedure, it can affect you for several hours. Do not drive or operate machinery until your health care provider says that it is safe. General instructions Take over-the-counter and prescription medicines only as told by your health care provider. If you will be going home right after the procedure, plan to have a responsible adult care for you for the time you are told. This is important. Keep all follow-up visits. This is important. Contact a health care provider if: You have a fever or chills. You have any of these signs of infection at your incision site: Redness, swelling, or pain. Fluid or blood. Warmth. Pus or a bad smell. Get help right away if: The incision area swells very fast. The incision area is bleeding, and the bleeding does not stop when you hold steady pressure on the area. Your arm or hand becomes pale, cool, tingly, or numb. These symptoms may represent a serious problem that is an emergency. Do not wait to see if the symptoms will go away. Get medical  help right away. Call your local emergency services (911 in the [place] Do not drive yourself to the hospital. Summary After the procedure, it is common to have bruising and tenderness at the incision site. Follow instructions from your health care provider about how to take care of your radial site incision. Check the incision every day for signs of infection. Do not lift anything that is heavier than 10 lb (4.5 kg), or the limit that you are told, until your health care provider says that it is  safe. Get help right away if the incision area swells very fast, you have bleeding at the incision site that will not stop, or your arm or hand becomes pale, cool, or numb. This information is not intended to replace advice given to you by your health care provider. Make sure you discuss any questions you have with your health care provider. Document Revised: 03/07/2020 Document Reviewed: 03/07/2020 Elsevier Patient Education  2024 [employer].

## 2024-01-29 NOTE — H&P (Signed)
 "  Cardiology Admission History and Physical   Patient ID: Roy [name] MRN: [id number]; DOB: 06-Apr-1960   Admission date: 01/29/2024  PCP:  Kennyth Domino, FNP   [facility] Providers Cardiologist:  None       Chief Complaint:  abnormal CT  Patient Profile: Roy [name] is a 63 y.o. male with resistant HTN, HLD, obesity who is being seen 01/29/2024 for the evaluation of CAD.  History of Present Illness: Mr. Quirino was seen last month by Dr [name] for evaluation of resistant HTN. Had coronary calcium  score that was high. This led to coronary CTA which was high risk suggesting left main proximal LAD stenosis. Patient denies any chest pain or SOB. Notes occasional mild flutter. No family history of CAD. Nonsmoker. Cardiac cath recommended due to high risk CTA findings.    Past Medical History:  Diagnosis Date   Abscess 02/27/2017   LEFT THIGH   Hyperlipidemia    Hypertension    Past Surgical History:  Procedure Laterality Date   CATARACT EXTRACTION Right    ELBOW SURGERY     HIP SURGERY Bilateral    replacement   IRRIGATION AND DEBRIDEMENT ABSCESS Left 02/28/2017   Procedure: IRRIGATION AND DEBRIDEMENT ABSCESS/THIGH;  Surgeon: Kinsinger, Herlene Righter, MD;  Location: [facility] OR;  Service: General;  Laterality: Left;   JOINT REPLACEMENT       Medications Prior to Admission: Prior to Admission medications  Medication Sig Start Date End Date Taking? Authorizing Provider  amLODipine  (NORVASC ) 10 MG tablet TAKE 1 TABLET BY MOUTH EVERY DAY 01/14/24  Yes Kennyth Domino, FNP  aspirin EC 81 MG tablet Take 81 mg by mouth daily. Swallow whole.   Yes [provider]  carvedilol (COREG) 12.5 MG tablet Take 12.5 mg by mouth 2 (two) times daily with a meal.   Yes [provider]  Multiple Vitamin (MULTIVITAMIN ADULT PO) Take 1 tablet by mouth daily.   Yes [provider]  Omega-3 Fatty Acids (FISH OIL PO) Take 1,200 mg by mouth. 360 mg Omega 3   Yes  [provider]  rosuvastatin  (CRESTOR ) 20 MG tablet Take 1 tablet (20 mg total) by mouth daily. 01/14/24 04/13/24 Yes Hilty, Vinie BROCKS, MD  telmisartan-hydrochlorothiazide  (MICARDIS HCT) 40-12.5 MG tablet Take 1 tablet by mouth every morning. 07/24/23  Yes [provider]  amoxicillin  (AMOXIL ) 500 MG capsule Take 500 mg by mouth as needed (for Dental procedures). 12/14/23   [provider]     Allergies:   Allergies[1]  Social History:   Social History   Socioeconomic History   Marital status: Married    Spouse name: Not on file   Number of children: Not on file   Years of education: Not on file   Highest education level: Not on file  Occupational History   Not on file  Tobacco Use   Smoking status: Never    Passive exposure: Never   Smokeless tobacco: Never  Vaping Use   Vaping status: Never Used  Substance and Sexual Activity   Alcohol use: Yes    Alcohol/week: 2.0 standard drinks of alcohol    Types: 2 Standard drinks or equivalent per week    Comment: Occassionally   Drug use: No   Sexual activity: Not on file  Other Topics Concern   Not on file  Social History Narrative   Not on file   Social Drivers of Health   Tobacco Use: Low Risk (01/22/2024)   Patient History  Smoking Tobacco Use: Never    Smokeless Tobacco Use: Never    Passive Exposure: Never  Financial Resource Strain: Not on file  Food Insecurity: Patient Declined (01/14/2024)   Epic    Worried About Programme Researcher, Broadcasting/film/video in the Last Year: Patient declined    Barista in the Last Year: Patient declined  Recent Concern: Food Insecurity - Food Insecurity Present (10/18/2023)   Epic    Worried About Programme Researcher, Broadcasting/film/video in the Last Year: Sometimes true    Ran Out of Food in the Last Year: Sometimes true  Transportation Needs: Patient Declined (01/14/2024)   Epic    Lack of Transportation (Medical): Patient declined    Lack of Transportation (Non-Medical): Patient  declined  Physical Activity: Not on file  Stress: Not on file  Social Connections: Not on file  Intimate Partner Violence: Patient Declined (01/14/2024)   Epic    Fear of Current or Ex-Partner: Patient declined    Emotionally Abused: Patient declined    Physically Abused: Patient declined    Sexually Abused: Patient declined  Depression (PHQ2-9): Low Risk (11/01/2023)   Depression (PHQ2-9)    PHQ-2 Score: 2  Recent Concern: Depression (PHQ2-9) - High Risk (10/18/2023)   Depression (PHQ2-9)    PHQ-2 Score: 11  Alcohol Screen: Not on file  Housing: Unknown (01/14/2024)   Epic    Unable to Pay for Housing in the Last Year: Patient declined    Number of Times Moved in the Last Year: 0    Homeless in the Last Year: Patient declined  Utilities: Patient Declined (01/14/2024)   Epic    Threatened with loss of utilities: Patient declined  Health Literacy: Not on file     Family History:   The patient's family history includes Colon polyps in his paternal uncle; Diabetes in his father; Prostate cancer in his paternal uncle. There is no history of Colon cancer, Stomach cancer, Esophageal cancer, Pancreatic cancer, or Rectal cancer.    ROS:  Please see the history of present illness.  All other ROS reviewed and negative.     Physical Exam/Data: Vitals:   01/29/24 1116  BP: (!) 151/99  Pulse: 84  Resp: 17  Temp: 98.2 F (36.8 C)  TempSrc: Oral  SpO2: 99%  Weight: 122.9 kg  Height: 6' 1 (1.854 m)    Intake/Output Summary (Last 24 hours) at 01/29/2024 1252 Last data filed at 01/29/2024 1125 Gross per 24 hour  Intake 500 ml  Output --  Net 500 ml      01/29/2024   11:16 AM 01/22/2024   12:26 PM 12/26/2023    2:08 PM  Last 3 Weights  Weight (lbs) 271 lb 270 lb 280 lb  Weight (kg) 122.925 kg 122.471 kg 127.007 kg     Body mass index is 35.75 kg/m.  General:  Well nourished, obese in no acute distress HEENT: normal Neck: no JVD Vascular: No carotid bruits; Distal  pulses 2+ bilaterally   Cardiac:  normal S1, S2; RRR; no murmur  Lungs:  clear to auscultation bilaterally, no wheezing, rhonchi or rales  Abd: soft, nontender, no hepatomegaly  Ext: no edema Musculoskeletal:  No deformities, BUE and BLE strength normal and equal Skin: warm and dry  Neuro:  CNs 2-12 intact, no focal abnormalities noted Psych:  Normal affect   EKG:  The ECG that was done today was personally reviewed and demonstrates NSR with mild T wave inversion in the lateral leads.  Relevant CV Studies: Cardiac/Coronary CTA   TECHNIQUE: A non-contrast, gated CT scan was obtained with axial slices of 2.5 mm through the heart for calcium  scoring. Calcium  scoring was performed using the Agatston method. A 120 kV prospective, gated, contrast cardiac CT scan was obtained. Gantry rotation speed was 230 msec and collimation was 0.63 mm. Two sublingual nitroglycerin  tablets (0.8 mg) were given. The 3D data set was reconstructed with motion correction for the best systolic or diastolic phase. Images were analyzed on a dedicated workstation using MPR, MIP, and VRT modes. The patient received 95 cc of contrast.   FINDINGS: Image quality: Average   Noise artifact is: Limited   Coronary calcium  score is 2265, which places the patient in the 99th percentile for age and sex matched control.   Coronary arteries: Normal coronary origins.  Right dominance.   Right Coronary Artery: Minimal plaque proximal RCA, <25% stenosis. Moderate plaque mid RCA, 50-69% stenosis. Diffusely diseased, patent PDA, PLA.   Left Main Coronary Artery: Severe distal LM stenosis, approximately 50%, cross sectional area of lumen 5.4 mm2, suggesting significant stenosis.   Left Anterior Descending Coronary Artery: Severe mid LAD plaque, 70-99% stenosis. Diffusely disease diagonal arteries, moderate plaque.   Ramus intermedius: Moderate proximal ramus intermedius plaque, 50-69% stenosis.   Left Circumflex  Artery: Small caliber diffusely diseased LCx.   Aorta: Normal size, 33 mm at the mid ascending aorta (level of the PA bifurcation) measured double oblique.   Aortic Valve: Trivial calcifications.   Other findings:   Normal pulmonary vein drainage into the left atrium.   Normal left atrial appendage without thrombus.   Moderate dilation of main pulmonary artery 37 mm, suggests pulmonary hypertension.   Please see separate report from [facility] for non-cardiac findings.   IMPRESSION: 1. Severe distal LM stenosis, approximately 50%, area appears 5 mm2. Severe mid LAD stenosis, 70-99%. CADRADS 4. CT FFR will be performed and reported separately.   2. Coronary calcium  score of 2265. This was 99th percentile for age and sex matched control.   3. Normal coronary origins with right dominance.   4. Moderate dilation of main pulmonary artery 37 mm, suggests pulmonary hypertension.   RECOMMENDATIONS: CAD-RADS 4 Severe stenosis. (70-99% or > 50% left main). Cardiac catheterization or CT FFR is recommended. Consider symptom-guided anti-ischemic pharmacotherapy as well as risk factor modification per guideline directed care.     Electronically Signed   By: Soyla Merck M.D.   On: 01/23/2024 15:03       FINDINGS: 1. Left Main: High likelihood of hemodynamic significance. While FFR values are borderline, there is a significant FFR delta (max 0.20) and reduced cross sectional area on CT suggesting significant stenosis.   2. LAD: High likelihood of hemodynamic significance. FFR delta 0.15 (0.8-0.65) 3. Ramus intermedius: High likelihood of hemodynamic significance. FFR delta 0.24 (0.82-0.58) 4. LCX: Low likelihood of hemodynamic significance. 5. RCA: Low likelihood of hemodynamic significance.   IMPRESSION: 1. CT FFR analysis showed hemodynamically significant stenosis in the mid LAD and ramus intermedius, and probable hemodynamically significant stenosis in  the distal LM.   Laboratory Data: High Sensitivity Troponin:  No results for input(s): TROPONINIHS in the last 720 hours. No results for input(s): TRNPT in the last 720 hours.      Chemistry Recent Labs  Lab 01/23/24 1058 01/29/24 1115  NA  --  138  K  --  4.3  CL  --  100  CO2  --  29  GLUCOSE  --  105*  BUN  --  27*  CREATININE 1.80* 1.32*  CALCIUM   --  9.3  GFRNONAA  --  >60  ANIONGAP  --  9    No results for input(s): PROT, ALBUMIN, AST, ALT, ALKPHOS, BILITOT in the last 168 hours. Lipids No results for input(s): CHOL, TRIG, HDL, LABVLDL, LDLCALC, CHOLHDL in the last 168 hours. Hematology Recent Labs  Lab 01/29/24 1115  WBC 10.8*  RBC 4.53  HGB 14.1  HCT 42.3  MCV 93.4  MCH 31.1  MCHC 33.3  RDW 12.7  PLT 221   Thyroid  No results for input(s): TSH, FREET4 in the last 168 hours. BNPNo results for input(s): BNP, PROBNP in the last 168 hours.  DDimer No results for input(s): DDIMER in the last 168 hours.  Radiology/Studies:  No results found.   Assessment and Plan: CAD with CT indicating significant Left main and LAD disease. Patient is asymptomatic. Plan cardiac cath to define further. The procedure and risks were reviewed including but not limited to death, myocardial infarction, stroke, arrythmias, bleeding, transfusion, emergency surgery, dye allergy, or renal dysfunction. The patient voices understanding and is agreeable to proceed.  HTN resistant HLD  Risk Assessment/Risk Scores:        Code Status: Full Code       Signed, Enrique Weiss, MD  01/29/2024 12:52 PM      [1] No Known Allergies  "

## 2024-01-29 NOTE — Interval H&P Note (Signed)
 History and Physical Interval Note:  01/29/2024 12:58 PM  Roy [name]  has presented today for surgery, with the diagnosis of abnormal ct.  The various methods of treatment have been discussed with the patient and family. After consideration of risks, benefits and other options for treatment, the patient has consented to  Procedures: LEFT HEART CATH AND CORONARY ANGIOGRAPHY (N/A) as a surgical intervention.  The patient's history has been reviewed, patient examined, no change in status, stable for surgery.  I have reviewed the patient's chart and labs.  Questions were answered to the patient's satisfaction.   Cath Lab Visit (complete for each Cath Lab visit)  Clinical Evaluation Leading to the Procedure:   ACS: No.  Non-ACS:    Anginal Classification: No Symptoms  Anti-ischemic medical therapy: Maximal Therapy (2 or more classes of medications)  Non-Invasive Test Results: High-risk stress test findings: cardiac mortality >3%/year  Prior CABG: No previous CABG        [name] 01/29/2024 12:58 PM

## 2024-01-30 ENCOUNTER — Encounter (HOSPITAL_COMMUNITY): Payer: Self-pay | Admitting: Cardiology

## 2024-02-07 ENCOUNTER — Telehealth: Admitting: Nurse Practitioner

## 2024-02-07 ENCOUNTER — Encounter: Payer: Self-pay | Admitting: Nurse Practitioner

## 2024-02-07 VITALS — BP 137/91 | HR 71 | Resp 16 | Ht 71.26 in | Wt 282.0 lb

## 2024-02-07 DIAGNOSIS — R7303 Prediabetes: Secondary | ICD-10-CM | POA: Diagnosis not present

## 2024-02-07 DIAGNOSIS — Z96642 Presence of left artificial hip joint: Secondary | ICD-10-CM

## 2024-02-07 DIAGNOSIS — Z0000 Encounter for general adult medical examination without abnormal findings: Secondary | ICD-10-CM

## 2024-02-07 DIAGNOSIS — I10 Essential (primary) hypertension: Secondary | ICD-10-CM | POA: Diagnosis not present

## 2024-02-07 NOTE — Progress Notes (Signed)
 "   Chief Complaint  Patient presents with   Annual Exam   Medicare Wellness    In person visit with Provider    Subjective:   Roy [name] is a 64 y.o. male who presents for a Welcome to Medicare Exam.   Visit info / Clinical Intake: Medicare Wellness Visit Type:: Welcome to [employer] (IPPE) Persons participating in visit and providing information:: patient Interpreter Needed?: No Pre-visit prep was completed: yes AWV questionnaire completed by patient prior to visit?: yes Living arrangements:: (Patient-Rptd) lives with spouse/significant other Patient's Overall Health Status Rating: (Patient-Rptd) good Typical amount of pain: (Patient-Rptd) some Does pain affect daily life?: (!) (Patient-Rptd) yes  Dietary Habits and Nutritional Risks How many meals a day?: (Patient-Rptd) 3 Eats fruit and vegetables daily?: (Patient-Rptd) yes Most meals are obtained by: (Patient-Rptd) preparing own meals  Functional Status Activities of Daily Living (to include ambulation/medication): Independent Ambulation: Independent Medication Administration: Independent Home Management (perform basic housework or laundry): Independent Manage your own finances?: (Patient-Rptd) yes Primary transportation is: (Patient-Rptd) driving  Fall Screening Falls in the past year?: 0 Number of falls in past year: 0 Was there an injury with Fall?: 0 Fall Risk Category Calculator: 0 Patient Fall Risk Level: Low Fall Risk  Fall Risk Patient at Risk for Falls Due to: No Fall Risks Fall risk Follow up: Falls evaluation completed  Home and Transportation Safety: All rugs have non-skid backing?: (Patient-Rptd) yes All stairs or steps have railings?: (Patient-Rptd) N/A, no stairs Grab bars in the bathtub or shower?: (Patient-Rptd) yes Have non-skid surface in bathtub or shower?: (Patient-Rptd) yes Good home lighting?: (Patient-Rptd) yes Regular seat belt use?: (Patient-Rptd) yes Hospital stays in the last  year:: (Patient-Rptd) no  Cognitive Assessment Difficulty concentrating, remembering, or making decisions? : (Patient-Rptd) no Was the patient able to repeat memory words in 3 tries?: yes Which version was used?: Version1 : banana, sunrise, chair Clock numbers correct?: yes Clock time correct (11:10)?: yes Normal clock drawing test?: 2 How many words correct?: 3 Which version was used?: Version 1: banana, sunrise, chair Mini-Cog Scoring: 5  Advance Directives (For Healthcare) Does Patient Have a Medical Advance Directive?: No Would patient like information on creating a medical advance directive?: Yes (Inpatient - patient defers creating a medical advance directive at this time - Information given)    Allergies (verified) Patient has no known allergies.   Current Medications (verified) Outpatient Encounter Medications as of 02/07/2024  Medication Sig   amLODipine  (NORVASC ) 10 MG tablet TAKE 1 TABLET BY MOUTH EVERY DAY   aspirin  EC 81 MG tablet Take 81 mg by mouth daily. Swallow whole.   carvedilol (COREG) 12.5 MG tablet Take 12.5 mg by mouth 2 (two) times daily with a meal.   Multiple Vitamin (MULTIVITAMIN ADULT PO) Take 1 tablet by mouth daily.   Omega-3 Fatty Acids (FISH OIL PO) Take 1,200 mg by mouth. 360 mg Omega 3   rosuvastatin  (CRESTOR ) 20 MG tablet Take 1 tablet (20 mg total) by mouth daily.   telmisartan-hydrochlorothiazide  (MICARDIS HCT) 40-12.5 MG tablet Take 1 tablet by mouth every morning.   [DISCONTINUED] amoxicillin  (AMOXIL ) 500 MG capsule Take 500 mg by mouth as needed (for Dental procedures).   No facility-administered encounter medications on file as of 02/07/2024.    History: Past Medical History:  Diagnosis Date   Abscess 02/27/2017   LEFT THIGH   Hyperlipidemia    Hypertension    Past Surgical History:  Procedure Laterality Date   CATARACT EXTRACTION Right  ELBOW SURGERY     HIP SURGERY Bilateral    replacement   IRRIGATION AND DEBRIDEMENT ABSCESS  Left 02/28/2017   Procedure: IRRIGATION AND DEBRIDEMENT ABSCESS/THIGH;  Surgeon: Kinsinger, Herlene Righter, MD;  Location: [facility] OR;  Service: General;  Laterality: Left;   JOINT REPLACEMENT     LEFT HEART CATH AND CORONARY ANGIOGRAPHY N/A 01/29/2024   Procedure: LEFT HEART CATH AND CORONARY ANGIOGRAPHY;  Surgeon: Jordan, Peter M, MD;  Location: [facility] INVASIVE CV LAB;  Service: Cardiovascular;  Laterality: N/A;   Family History  Problem Relation Age of Onset   Diabetes Father    Prostate cancer Paternal Uncle    Colon polyps Paternal Uncle    Colon cancer Neg Hx    Stomach cancer Neg Hx    Esophageal cancer Neg Hx    Pancreatic cancer Neg Hx    Rectal cancer Neg Hx    Social History   Occupational History   Not on file  Tobacco Use   Smoking status: Never    Passive exposure: Never   Smokeless tobacco: Never  Vaping Use   Vaping status: Never Used  Substance and Sexual Activity   Alcohol use: Yes    Alcohol/week: 2.0 standard drinks of alcohol    Types: 2 Standard drinks or equivalent per week    Comment: Occassionally   Drug use: No   Sexual activity: Not on file   Tobacco Counseling Counseling given: Not Answered  SDOH Screenings   Food Insecurity: Patient Declined (02/07/2024)  Housing: Unknown (02/07/2024)  Transportation Needs: Patient Declined (02/07/2024)  Utilities: Patient Declined (02/07/2024)  Depression (PHQ2-9): Low Risk (02/07/2024)  Tobacco Use: Low Risk (02/08/2024)   See flowsheets for full screening details  Depression Screen PHQ 2 & 9 Depression Scale- Over the past 2 weeks, how often have you been bothered by any of the following problems? Little interest or pleasure in doing things: 0 Feeling down, depressed, or hopeless (PHQ Adolescent also includes...irritable): 0 PHQ-2 Total Score: 0 Trouble falling or staying asleep, or sleeping too much: 0 Feeling tired or having little energy: 0 Poor appetite or overeating (PHQ Adolescent also includes...weight loss):  0 Feeling bad about yourself - or that you are a failure or have let yourself or your family down: 0 Trouble concentrating on things, such as reading the newspaper or watching television (PHQ Adolescent also includes...like school work): 0 Moving or speaking so slowly that other people could have noticed. Or the opposite - being so fidgety or restless that you have been moving around a lot more than usual: 0 Thoughts that you would be better off dead, or of hurting yourself in some way: 0 PHQ-9 Total Score: 2 If you checked off any problems, how difficult have these problems made it for you to do your work, take care of things at home, or get along with other people?: Not difficult at all  Depression Treatment Depression Interventions/Treatment : [facility] Provided      Goals Addressed             This Visit's Progress    DIET - REDUCE SALT INTAKE TO 2 GRAMS PER DAY OR LESS       Reduce blood pressure      DIET - REDUCE SUGAR INTAKE       Lower A1C              Objective:    Today's Vitals   02/07/24 1134  BP: (!) 137/91  Pulse: 71  Resp:  16  SpO2: 96%  Weight: 282 lb (127.9 kg)  Height: 5' 11.26 (1.81 m)  PainSc: 0-No pain   Body mass index is 39.04 kg/m.   Physical Exam Constitutional:      General: He is not in acute distress.    Appearance: Normal appearance. He is obese.  HENT:     Head: Normocephalic.     Right Ear: Tympanic membrane, ear canal and external ear normal.     Left Ear: Tympanic membrane, ear canal and external ear normal.     Nose: Nose normal.     Mouth/Throat:     Mouth: Mucous membranes are moist.  Eyes:     Extraocular Movements: Extraocular movements intact.     Pupils: Pupils are equal, round, and reactive to light.  Cardiovascular:     Rate and Rhythm: Normal rate and regular rhythm.     Pulses: Normal pulses.     Heart sounds: Normal heart sounds.  Pulmonary:     Effort: Pulmonary effort is normal.     Breath  sounds: Normal breath sounds.  Abdominal:     Palpations: Abdomen is soft.  Musculoskeletal:        General: Normal range of motion.     Cervical back: Neck supple.  Skin:    General: Skin is warm.     Capillary Refill: Capillary refill takes less than 2 seconds.  Neurological:     General: No focal deficit present.     Mental Status: He is alert and oriented to person, place, and time.  Psychiatric:        Mood and Affect: Mood normal.        Behavior: Behavior normal.        Thought Content: Thought content normal.        Judgment: Judgment normal.       Hearing/Vision screen No results found. Immunizations and Health Maintenance Health Maintenance  Topic Date Due   DTaP/Tdap/Td (1 - Tdap) Never done   Pneumococcal Vaccine: 50+ Years (1 of 2 - PCV) Never done   Zoster Vaccines- Shingrix (1 of 2) Never done   Colonoscopy  07/06/2022   Influenza Vaccine  08/31/2023   COVID-19 Vaccine (3 - 2025-26 season) 10/01/2023   Medicare Annual Wellness (AWV)  02/06/2025   Hepatitis C Screening  Completed   HIV Screening  Completed   Hepatitis B Vaccines 19-59 Average Risk  Aged Out   HPV VACCINES  Aged Out   Meningococcal B Vaccine  Aged Out         Assessment/Plan:  This is a routine wellness examination for [place].  Patient Care Team: Kennyth Domino, FNP as PCP - General (Nurse Practitioner)  I have personally reviewed and noted the following in the patients chart:   Medical and social history Use of alcohol, tobacco or illicit drugs  Current medications and supplements including opioid prescriptions. Functional ability and status Nutritional status Physical activity Advanced directives List of other physicians Hospitalizations, surgeries, and ER visits in previous 12 months Vitals Screenings to include cognitive, depression, and falls Referrals and appointments  Orders Placed This Encounter  Procedures   Hgb A1c w/o eAG   Comprehensive metabolic panel with GFR    Lipid panel   In addition, I have reviewed and discussed with patient certain preventive protocols, quality metrics, and best practice recommendations. A written personalized care plan for preventive services as well as general preventive health recommendations were provided to patient.   Domino Kennyth, FNP  02/08/2024   Return in 1 year (on 02/06/2025).  "

## 2024-02-07 NOTE — Patient Instructions (Addendum)
 With breakfast daily: Amlodipine   With Lunch daily: telmisartan-hydrochlorothiazide  (MICARDIS HCT) 40-12.5 MG tablet   With Dinner: carvedilol (COREG) 12.5 MG tablet  Health Maintenance, Male Adopting a healthy lifestyle and getting preventive care are important in promoting health and wellness. Ask your health care provider about: The right schedule for you to have regular tests and exams. Things you can do on your own to prevent diseases and keep yourself healthy. What should I know about diet, weight, and exercise? Eat a healthy diet  Eat a diet that includes plenty of vegetables, fruits, low-fat dairy products, and lean protein. Do not eat a lot of foods that are high in solid fats, added sugars, or sodium. Maintain a healthy weight Body mass index (BMI) is a measurement that can be used to identify possible weight problems. It estimates body fat based on height and weight. Your health care provider can help determine your BMI and help you achieve or maintain a healthy weight. Get regular exercise Get regular exercise. This is one of the most important things you can do for your health. Most adults should: Exercise for at least 150 minutes each week. The exercise should increase your heart rate and make you sweat (moderate-intensity exercise). Do strengthening exercises at least twice a week. This is in addition to the moderate-intensity exercise. Spend less time sitting. Even light physical activity can be beneficial. Watch cholesterol and blood lipids Have your blood tested for lipids and cholesterol at 64 years of age, then have this test every 5 years. You may need to have your cholesterol levels checked more often if: Your lipid or cholesterol levels are high. You are older than 64 years of age. You are at high risk for heart disease. What should I know about cancer screening? Many types of cancers can be detected early and may often be prevented. Depending on your health history  and family history, you may need to have cancer screening at various ages. This may include screening for: Colorectal cancer. Prostate cancer. Skin cancer. Lung cancer. What should I know about heart disease, diabetes, and high blood pressure? Blood pressure and heart disease High blood pressure causes heart disease and increases the risk of stroke. This is more likely to develop in people who have high blood pressure readings or are overweight. Talk with your health care provider about your target blood pressure readings. Have your blood pressure checked: Every 3-5 years if you are 30-16 years of age. Every year if you are 83 years old or older. If you are between the ages of 34 and 16 and are a current or former smoker, ask your health care provider if you should have a one-time screening for abdominal aortic aneurysm (AAA). Diabetes Have regular diabetes screenings. This checks your fasting blood sugar level. Have the screening done: Once every three years after age 72 if you are at a normal weight and have a low risk for diabetes. More often and at a younger age if you are overweight or have a high risk for diabetes. What should I know about preventing infection? Hepatitis B If you have a higher risk for hepatitis B, you should be screened for this virus. Talk with your health care provider to find out if you are at risk for hepatitis B infection. Hepatitis C Blood testing is recommended for: Everyone born from [birth year] through 1965. Anyone with known risk factors for hepatitis C. Sexually transmitted infections (STIs) You should be screened each year for STIs, including gonorrhea and  chlamydia, if: You are sexually active and are younger than 64 years of age. You are older than 64 years of age and your health care provider tells you that you are at risk for this type of infection. Your sexual activity has changed since you were last screened, and you are at increased risk for chlamydia  or gonorrhea. Ask your health care provider if you are at risk. Ask your health care provider about whether you are at high risk for HIV. Your health care provider may recommend a prescription medicine to help prevent HIV infection. If you choose to take medicine to prevent HIV, you should first get tested for HIV. You should then be tested every 3 months for as long as you are taking the medicine. Follow these instructions at home: Alcohol use Do not drink alcohol if your health care provider tells you not to drink. If you drink alcohol: Limit how much you have to 0-2 drinks a day. Know how much alcohol is in your drink. In the [place] one drink equals one 12 oz bottle of beer (355 mL), one 5 oz glass of wine (148 mL), or one 1 oz glass of hard liquor (44 mL). Lifestyle Do not use any products that contain nicotine or tobacco. These products include cigarettes, chewing tobacco, and vaping devices, such as e-cigarettes. If you need help quitting, ask your health care provider. Do not use street drugs. Do not share needles. Ask your health care provider for help if you need support or information about quitting drugs. General instructions Schedule regular health, dental, and eye exams. Stay current with your vaccines. Tell your health care provider if: You often feel depressed. You have ever been abused or do not feel safe at home. Summary Adopting a healthy lifestyle and getting preventive care are important in promoting health and wellness. Follow your health care provider's instructions about healthy diet, exercising, and getting tested or screened for diseases. Follow your health care provider's instructions on monitoring your cholesterol and blood pressure. This information is not intended to replace advice given to you by your health care provider. Make sure you discuss any questions you have with your health care provider. Document Revised: 06/07/2020 Document Reviewed:  06/07/2020 Elsevier Patient Education  2024 [employer].

## 2024-02-07 NOTE — Progress Notes (Signed)
 The patient presented for a primary care visit on 02/07/2024 for annual exam. Blood pressure screening was conducted, and the result was 137/91. During the appointment, the patient did not report any new SDOH needs and declined the screener.   A review of the patient's chart revealed that they do have a PCP (Lauraine Kitty, NP - [facility])  At this time, no additional support from the Health Equity team is necessary.

## 2024-02-08 ENCOUNTER — Ambulatory Visit: Payer: Self-pay | Admitting: Nurse Practitioner

## 2024-02-08 ENCOUNTER — Encounter: Payer: Self-pay | Admitting: Nurse Practitioner

## 2024-02-08 LAB — COMPREHENSIVE METABOLIC PANEL WITH GFR
ALT: 33 IU/L (ref 0–44)
AST: 66 IU/L — ABNORMAL HIGH (ref 0–40)
Albumin: 4.3 g/dL (ref 3.9–4.9)
Alkaline Phosphatase: 103 IU/L (ref 47–123)
BUN/Creatinine Ratio: 20 (ref 10–24)
BUN: 24 mg/dL (ref 8–27)
Bilirubin Total: 0.4 mg/dL (ref 0.0–1.2)
CO2: 25 mmol/L (ref 20–29)
Calcium: 9.4 mg/dL (ref 8.6–10.2)
Chloride: 100 mmol/L (ref 96–106)
Creatinine, Ser: 1.21 mg/dL (ref 0.76–1.27)
Globulin, Total: 2.9 g/dL (ref 1.5–4.5)
Glucose: 77 mg/dL (ref 70–99)
Potassium: 4.1 mmol/L (ref 3.5–5.2)
Sodium: 140 mmol/L (ref 134–144)
Total Protein: 7.2 g/dL (ref 6.0–8.5)
eGFR: 67 mL/min/1.73

## 2024-02-08 LAB — LIPID PANEL
Chol/HDL Ratio: 2.6 ratio (ref 0.0–5.0)
Cholesterol, Total: 119 mg/dL (ref 100–199)
HDL: 45 mg/dL
LDL Chol Calc (NIH): 59 mg/dL (ref 0–99)
Triglycerides: 74 mg/dL (ref 0–149)
VLDL Cholesterol Cal: 15 mg/dL (ref 5–40)

## 2024-02-08 LAB — HGB A1C W/O EAG: Hgb A1c MFr Bld: 5.8 % — ABNORMAL HIGH (ref 4.8–5.6)

## 2024-02-08 NOTE — Progress Notes (Signed)
 The patient attended a screening event on 01/14/2024 where his screening results were a BP of 170/96. Per chart review the patient has Lauraine Kitty, FNP as his PCP, has [employer], and does not have any SDOH needs  [facility] attempted to reach patient for initial f/u but was unable to reach patient.Letter sent with [facility] and [facility] PCP resource flyers, [place] and [employer] and [facility] financial assistance information flyers . In case needed by pt. An additional follow up will be done in according to the health equity team's protocol.

## 2024-02-10 NOTE — Progress Notes (Unsigned)
 "  Cardiology Office Note    Date:  02/11/2024  ID:  Roy [name], DOB Feb 12, 1960, MRN [id number] PCP:  Roy Domino, FNP  Cardiologist:  None  Electrophysiologist:  None   Chief Complaint: Follow up for CAD and HTN   History of Present Illness: Roy [name] is a 64 y.o. male with visit-pertinent history of hypertension and CAD.  Patient first evaluated by Dr. [name] on 12/26/2023 for management of resistant hypertension.  It was noted last blood pressure had been difficult to control, was placed on higher dose amlodipine  in addition to twice daily carvedilol and telmisartan/HCTZ.  At office visit patient reported occasionally having elevated blood pressure but actually noted low blood pressures at home with episodes of presyncope.  It was felt that his presyncope was related to dehydration, is recommended that he continue on current antihypertensive regimen.  Coronary calcium  scoring was recommended for further restratification.  Coronary calcium  score on 01/10/2024 indicated coronary calcium  score of 2039, 99th percentile for age, race and sex matched control.  Patient underwent coronary CTA on 01/23/2024 which indicated severe distal LM stenosis, proximally 50%, severe mid LAD stenosis, 70 to 99%, coronary calcium  score of 2265.  CT FFR showed high likelihood of hemodynamic significance of the left main and LAD and ramus intermedius.  Patient underwent cardiac catheterization on 01/29/2024 with no significant obstruction in the left main, LAD, ramus or LCx.  Proximal LAD to mid LAD lesion 20% stenosed, distal LM lesion 35% stenosed, first marginal lesion 40%, RPDA lesion 70% stenosed.  LVEF 55 to 65%.  Today he presents for follow-up.  He reports that he has been doing well overall.  He denies any chest pain, shortness of breath, lower extremity edema, orthopnea or PND.  He denies any recent syncope or palpitations.  Patient notes that previously when he was taking all medications  consistently he did have drops of blood pressure in which he would feel dizzy and lightheaded with occasional presyncope.  Patient notes that he monitors his blood pressure and takes medications as his blood pressure increases.  Labwork independently reviewed: 02/07/2024: Sodium 140, Tessman 4.1, creatinine 1.21, AST 66, ALT 33 ROS: .   Today he denies chest pain, shortness of breath, lower extremity edema, fatigue, palpitations, melena, hematuria, hemoptysis, diaphoresis, weakness, presyncope, syncope, orthopnea, and PND.  All other systems are reviewed and otherwise negative. Studies Reviewed: SABRA   EKG:  EKG is ordered today, personally reviewed, demonstrating  EKG Interpretation Date/Time:  Monday February 11 2024 13:23:49 EST Ventricular Rate:  85 PR Interval:  198 QRS Duration:  90 QT Interval:  362 QTC Calculation: 430 R Axis:   25  Text Interpretation: Sinus rhythm with occasional Premature ventricular complexes Nonspecific T wave abnormality When compared with ECG of 29-Jan-2024 11:27, Premature ventricular complexes are now Present Confirmed by Aliah Eriksson [phone]) on 02/11/2024 1:31:34 PM   CV Studies: Cardiac studies reviewed are outlined and summarized above. Otherwise please see EMR for full report. Cardiac Studies & Procedures   ______________________________________________________________________________________________ CARDIAC CATHETERIZATION  CARDIAC CATHETERIZATION 01/29/2024  Conclusion   Dist LM lesion is 35% stenosed.   Prox LAD to Mid LAD lesion is 20% stenosed.   Ramus lesion is 25% stenosed.   1st Mrg lesion is 40% stenosed.   Prox RCA lesion is 30% stenosed.   RPDA lesion is 70% stenosed.   The left ventricular systolic function is normal.   LV end diastolic pressure is mildly elevated.   The left  ventricular ejection fraction is 55-65% by visual estimate.  No significant obstruction in the left main, LAD, ramus or LCx. 70% mid PDA Normal LV function Mildly  elevated LVEDP 21 mm Hg  Recommend medical management and risk factor modification.  Findings Coronary Findings Diagnostic  Dominance: Right  Left Main Dist LM lesion is 35% stenosed.  Left Anterior Descending Prox LAD to Mid LAD lesion is 20% stenosed. The lesion is calcified.  Ramus Intermedius Ramus lesion is 25% stenosed.  Left Circumflex  First Obtuse Marginal Branch 1st Mrg lesion is 40% stenosed.  Right Coronary Artery Prox RCA lesion is 30% stenosed.  Right Posterior Descending Artery RPDA lesion is 70% stenosed.  Intervention  No interventions have been documented.          CT SCANS  CT CORONARY MORPH W/CTA COR W/SCORE 01/23/2024  Narrative CLINICAL DATA:  coronary artery calcifications, resistant hypertension  EXAM: Cardiac/Coronary CTA  TECHNIQUE: A non-contrast, gated CT scan was obtained with axial slices of 2.5 mm through the heart for calcium  scoring. Calcium  scoring was performed using the Agatston method. A 120 kV prospective, gated, contrast cardiac CT scan was obtained. Gantry rotation speed was 230 msec and collimation was 0.63 mm. Two sublingual nitroglycerin  tablets (0.8 mg) were given. The 3D data set was reconstructed with motion correction for the best systolic or diastolic phase. Images were analyzed on a dedicated workstation using MPR, MIP, and VRT modes. The patient received 95 cc of contrast.  FINDINGS: Image quality: Average  Noise artifact is: Limited  Coronary calcium  score is 2265, which places the patient in the 99th percentile for age and sex matched control.  Coronary arteries: Normal coronary origins.  Right dominance.  Right Coronary Artery: Minimal plaque proximal RCA, <25% stenosis. Moderate plaque mid RCA, 50-69% stenosis. Diffusely diseased, patent PDA, PLA.  Left Main Coronary Artery: Severe distal LM stenosis, approximately 50%, cross sectional area of lumen 5.4 mm2, suggesting  significant stenosis.  Left Anterior Descending Coronary Artery: Severe mid LAD plaque, 70-99% stenosis. Diffusely disease diagonal arteries, moderate plaque.  Ramus intermedius: Moderate proximal ramus intermedius plaque, 50-69% stenosis.  Left Circumflex Artery: Small caliber diffusely diseased LCx.  Aorta: Normal size, 33 mm at the mid ascending aorta (level of the PA bifurcation) measured double oblique.  Aortic Valve: Trivial calcifications.  Other findings:  Normal pulmonary vein drainage into the left atrium.  Normal left atrial appendage without thrombus.  Moderate dilation of main pulmonary artery 37 mm, suggests pulmonary hypertension.  Please see separate report from [facility] for non-cardiac findings.  IMPRESSION: 1. Severe distal LM stenosis, approximately 50%, area appears 5 mm2. Severe mid LAD stenosis, 70-99%. CADRADS 4. CT FFR will be performed and reported separately.  2. Coronary calcium  score of 2265. This was 99th percentile for age and sex matched control.  3. Normal coronary origins with right dominance.  4. Moderate dilation of main pulmonary artery 37 mm, suggests pulmonary hypertension.  RECOMMENDATIONS: CAD-RADS 4 Severe stenosis. (70-99% or > 50% left main). Cardiac catheterization or CT FFR is recommended. Consider symptom-guided anti-ischemic pharmacotherapy as well as risk factor modification per guideline directed care.   Electronically Signed By: Soyla Merck M.D. On: 01/23/2024 15:03   CT SCANS  CT CARDIAC SCORING (SELF PAY ONLY) 01/10/2024  Addendum 01/20/2024 12:06 AM ADDENDUM REPORT: 01/20/2024 00:04  EXAM: OVER-READ INTERPRETATION  CT CHEST  The following report is an over-read performed by radiologist Dr. Oneil Devonshire of [facility] on 01/20/2024. This over-read does not  include interpretation of cardiac or coronary anatomy or pathology. The coronary calcium  score interpretation  by the cardiologist is attached.  COMPARISON:  None.  FINDINGS: Cardiovascular: There are no significant extracardiac vascular findings.  Mediastinum/Nodes: There are no enlarged lymph nodes within the visualized mediastinum.  Lungs/Pleura: There is no pleural effusion. Mild scarring is noted in the lung bases bilaterally. Single calcified granuloma is noted right middle lobe.  Upper abdomen: No significant findings in the visualized upper abdomen.  Musculoskeletal/Chest wall: No chest wall mass or suspicious osseous findings within the visualized chest.  IMPRESSION: No significant extracardiac findings within the visualized chest.   Electronically Signed By: Oneil Devonshire M.D. On: 01/20/2024 00:04  Narrative CLINICAL DATA:  Cardiovascular Disease Risk stratification  EXAM: Coronary Calcium  Score  TECHNIQUE: A gated, non-contrast computed tomography scan of the heart was performed using 3mm slice thickness. Axial images were analyzed on a dedicated workstation. Calcium  scoring of the coronary arteries was performed using the Agatston method.  FINDINGS: Coronary arteries: Normal origins.  Coronary Calcium  Score:  Left main: 0  Left anterior descending artery: 906  Left circumflex artery: 21.6  Right coronary artery: 1111  Total: 2039  Percentile: 99th  Pericardium: Normal.  Ascending Aorta: Normal caliber.  Non-cardiac: See separate report from [facility].  IMPRESSION: Coronary calcium  score of 2039. This was 99th percentile for age-, race-, and sex-matched controls.  RECOMMENDATIONS: Coronary artery calcium  (CAC) score is a strong predictor of incident coronary heart disease (CHD) and provides predictive information beyond traditional risk factors. CAC scoring is reasonable to use in the decision to withhold, postpone, or initiate statin therapy in intermediate-risk or selected borderline-risk asymptomatic adults (age 67-75 years and  LDL-C >=70 to <190 mg/dL) who do not have diabetes or established atherosclerotic cardiovascular disease (ASCVD).* In intermediate-risk (10-year ASCVD risk >=7.5% to <20%) adults or selected borderline-risk (10-year ASCVD risk >=5% to <7.5%) adults in whom a CAC score is measured for the purpose of making a treatment decision the following recommendations have been made:  If CAC=0, it is reasonable to withhold statin therapy and reassess in 5 to 10 years, as long as higher risk conditions are absent (diabetes mellitus, family history of premature CHD in first degree relatives (males <55 years; females <65 years), cigarette smoking, or LDL >=190 mg/dL).  If CAC is 1 to 99, it is reasonable to initiate statin therapy for patients >=64 years of age.  If CAC is >=100 or >=75th percentile, it is reasonable to initiate statin therapy at any age.  Cardiology referral should be considered for patients with CAC scores >=400 or >=75th percentile.  *2018 AHA/ACC/AACVPR/AAPA/ABC/ACPM/ADA/AGS/APhA/ASPC/NLA/PCNA Guideline on the Management of Blood Cholesterol: A Report of the American College of Cardiology/American Heart Association Task Force on Clinical Practice Guidelines. J Am Coll Cardiol. 2019;73(24):3168-3209.  Wilbert Bihari, MD  Electronically Signed: By: Wilbert Bihari M.D. On: 01/10/2024 17:50     ______________________________________________________________________________________________       Current Reported Medications:.    Active Medications[1]  Physical Exam:    VS:  BP (!) 146/84   Pulse 85   Ht 6' (1.829 m)   Wt 287 lb 6.4 oz (130.4 kg)   SpO2 98%   BMI 38.98 kg/m    Wt Readings from Last 3 Encounters:  02/11/24 287 lb 6.4 oz (130.4 kg)  02/07/24 282 lb (127.9 kg)  01/29/24 271 lb (122.9 kg)    GEN: Well nourished, well developed in no acute distress NECK: No JVD; No carotid bruits CARDIAC:  RRR, no murmurs, rubs, gallops RESPIRATORY:  Clear to  auscultation without rales, wheezing or rhonchi  ABDOMEN: Soft, non-tender, non-distended EXTREMITIES:  No edema; No acute deformity     Asessement and Plan:.    CAD: Coronary CTA on 01/23/2024 which indicated severe distal LM stenosis, proximally 50%, severe mid LAD stenosis, 70 to 99%, coronary calcium  score of 2265.  CT FFR showed high likelihood of hemodynamic significance of the left main and LAD and ramus intermedius.  Patient underwent cardiac catheterization on 01/29/2024 with no significant obstruction in the left main, LAD, ramus or LCx.  Proximal LAD to mid LAD lesion 20% stenosed, distal LM lesion 35% stenosed, first marginal lesion 40%, RPDA lesion 70% stenosed.  LVEF 55 to 65%. Stable with no anginal symptoms. No indication for ischemic evaluation.  Heart healthy diet and regular cardiovascular exercise encouraged.  Cath site is clean intact without evidence of hematoma. Continue amlodipine  10 mg daily, aspirin  81 mg daily, carvedilol 12.5 mg twice daily, Crestor  20 mg daily and telmisartan-HCTZ 40-12.5 mg daily.  HTN: Blood pressure today 146/84, patient reports that he has not yet taken all of his blood pressure medications.  Patient reports he is only taken amlodipine  this morning, has not taken his first dose of carvedilol, plans to resume when he returns home.  Patient reports that he will take his blood pressure medications staggered throughout the day as when he takes all of them at once he has significant dizziness, lightheadedness and has previously had episodes of presyncope.  Encouraged patient to monitor his blood pressure at home and take note of when he takes his blood pressure medication, likely needs titration, patient deferred today.  He will monitor his blood pressure at home and notify the office if consistently elevated of 130/80 or if he experiences symptoms of hypotension.  Continue amlodipine  10 mg daily, carvedilol 12.5 mg twice daily and telmisartan-HCTZ 40-12.5 mg  daily.  HLD: Last lipid profile on 02/07/2024 indicated total cholesterol 119, HDL 45, triglycerides 74 and LDL 59.  Continue Crestor  20 mg daily.   Disposition: F/u with Bronda Alfred, NP in three months or sooner if needed.   Signed, Jaideep Pollack D Saskia Simerson, NP       [1]  Current Meds  Medication Sig   amLODipine  (NORVASC ) 10 MG tablet TAKE 1 TABLET BY MOUTH EVERY DAY   aspirin  EC 81 MG tablet Take 81 mg by mouth daily. Swallow whole.   carvedilol (COREG) 12.5 MG tablet Take 12.5 mg by mouth 2 (two) times daily with a meal.   Multiple Vitamin (MULTIVITAMIN ADULT PO) Take 1 tablet by mouth daily.   Omega-3 Fatty Acids (FISH OIL PO) Take 1,200 mg by mouth. 360 mg Omega 3   rosuvastatin  (CRESTOR ) 20 MG tablet Take 1 tablet (20 mg total) by mouth daily.   telmisartan-hydrochlorothiazide  (MICARDIS HCT) 40-12.5 MG tablet Take 1 tablet by mouth every morning.   "

## 2024-02-11 ENCOUNTER — Encounter: Payer: Self-pay | Admitting: Gastroenterology

## 2024-02-11 ENCOUNTER — Encounter: Payer: Self-pay | Admitting: Cardiology

## 2024-02-11 ENCOUNTER — Ambulatory Visit: Attending: Cardiology | Admitting: Cardiology

## 2024-02-11 VITALS — BP 146/84 | HR 85 | Ht 72.0 in | Wt 287.4 lb

## 2024-02-11 DIAGNOSIS — I2510 Atherosclerotic heart disease of native coronary artery without angina pectoris: Secondary | ICD-10-CM

## 2024-02-11 DIAGNOSIS — I1A0 Resistant hypertension: Secondary | ICD-10-CM | POA: Diagnosis not present

## 2024-02-11 DIAGNOSIS — I10 Essential (primary) hypertension: Secondary | ICD-10-CM | POA: Diagnosis not present

## 2024-02-11 DIAGNOSIS — E7800 Pure hypercholesterolemia, unspecified: Secondary | ICD-10-CM

## 2024-02-11 NOTE — Patient Instructions (Signed)
 Medication Instructions:  Your physician recommends that you continue on your current medications as directed. Please refer to the Current Medication list given to you today.  *If you need a refill on your cardiac medications before your next appointment, please call your pharmacy*  Lab Work: NONE If you have labs (blood work) drawn today and your tests are completely normal, you will receive your results only by: MyChart Message (if you have MyChart) OR A paper copy in the mail If you have any lab test that is abnormal or we need to change your treatment, we will call you to review the results.  Testing/Procedures: NONE  Follow-Up: At [facility], you and your health needs are our priority.  As part of our continuing mission to provide you with exceptional heart care, our providers are all part of one team.  This team includes your primary Cardiologist (physician) and Advanced Practice Providers or APPs (Physician Assistants and Nurse Practitioners) who all work together to provide you with the care you need, when you need it.  Your next appointment:   3 month(s)  Provider:   Katlyn West, NP

## 2024-02-19 ENCOUNTER — Encounter: Payer: Self-pay | Admitting: Gastroenterology

## 2024-02-19 ENCOUNTER — Ambulatory Visit: Admitting: Gastroenterology

## 2024-02-19 VITALS — BP 127/77 | HR 74 | Temp 97.5°F | Resp 12 | Ht 73.0 in | Wt 270.0 lb

## 2024-02-19 DIAGNOSIS — K562 Volvulus: Secondary | ICD-10-CM | POA: Diagnosis not present

## 2024-02-19 DIAGNOSIS — Z1211 Encounter for screening for malignant neoplasm of colon: Secondary | ICD-10-CM | POA: Diagnosis not present

## 2024-02-19 DIAGNOSIS — K56609 Unspecified intestinal obstruction, unspecified as to partial versus complete obstruction: Secondary | ICD-10-CM

## 2024-02-19 DIAGNOSIS — K529 Noninfective gastroenteritis and colitis, unspecified: Secondary | ICD-10-CM | POA: Diagnosis not present

## 2024-02-19 DIAGNOSIS — K5730 Diverticulosis of large intestine without perforation or abscess without bleeding: Secondary | ICD-10-CM | POA: Diagnosis not present

## 2024-02-19 MED ORDER — SODIUM CHLORIDE 0.9 % IV SOLN: 500.0000 mL | Freq: Once | INTRAVENOUS | Status: DC

## 2024-02-19 NOTE — Progress Notes (Signed)
 Pt's states no medical or surgical changes since previsit or office visit.

## 2024-02-19 NOTE — Progress Notes (Signed)
 [name] History and Physical   Primary Care Physician:  Kennyth Domino, FNP   Reason for Procedure:   Colon cancer screening  Plan:    Colonoscopy   HPI: Roy [name] is a 64 y.o. male undergoing average risk screening colonoscopy.  He has no family history of colon cancer and no chronic GI symptoms.  A colonoscopy in May 2023 was incomplete due poor prep and sigmoid narrowing that was not traversable.  A repeat colonoscopy in June 2023 was notable for sigmoid diverticulosis with associated luminal narrowing and erythema/mucosal prolapse, but no polyps were seen.  Bowel prep was again inadequate.   The patient was provided an opportunity to ask questions and all were answered. The patient agreed with the plan    Past Medical History:  Diagnosis Date   Abscess 02/27/2017   LEFT THIGH   Hyperlipidemia    Hypertension     Past Surgical History:  Procedure Laterality Date   CATARACT EXTRACTION Right    ELBOW SURGERY     HIP SURGERY Bilateral    replacement   IRRIGATION AND DEBRIDEMENT ABSCESS Left 02/28/2017   Procedure: IRRIGATION AND DEBRIDEMENT ABSCESS/THIGH;  Surgeon: Kinsinger, Herlene Righter, MD;  Location: [facility] OR;  Service: General;  Laterality: Left;   JOINT REPLACEMENT     LEFT HEART CATH AND CORONARY ANGIOGRAPHY N/A 01/29/2024   Procedure: LEFT HEART CATH AND CORONARY ANGIOGRAPHY;  Surgeon: Jordan, Peter M, MD;  Location: [facility] INV[facility] CV [facility];  Service: Cardiovascular;  Laterality: N/A;    Prior to Admission medications  Medication Sig Start Date End Date Taking? Authorizing Provider  amLODipine  (NORVASC ) 10 MG tablet TAKE 1 TABLET BY MOUTH EVERY DAY 01/14/24  Yes Kennyth Domino, FNP  carvedilol (COREG) 12.5 MG tablet Take 12.5 mg by mouth 2 (two) times daily with a meal.   Yes [provider]  rosuvastatin  (CRESTOR ) 20 MG tablet Take 1 tablet (20 mg total) by mouth daily. 01/14/24 04/13/24 Yes Hilty, Vinie BROCKS, MD  telmisartan-hydrochlorothiazide   (MICARDIS HCT) 40-12.5 MG tablet Take 1 tablet by mouth every morning. 07/24/23  Yes [provider]  aspirin  EC 81 MG tablet Take 81 mg by mouth daily. Swallow whole.    [provider]  Multiple Vitamin (MULTIVITAMIN ADULT PO) Take 1 tablet by mouth daily.    [provider]  Omega-3 Fatty Acids (FISH OIL PO) Take 1,200 mg by mouth. 360 mg Omega 3    [provider]    Current Outpatient Medications  Medication Sig Dispense Refill   amLODipine  (NORVASC ) 10 MG tablet TAKE 1 TABLET BY MOUTH EVERY DAY 90 tablet 1   carvedilol (COREG) 12.5 MG tablet Take 12.5 mg by mouth 2 (two) times daily with a meal.     rosuvastatin  (CRESTOR ) 20 MG tablet Take 1 tablet (20 mg total) by mouth daily. 90 tablet 3   telmisartan-hydrochlorothiazide  (MICARDIS HCT) 40-12.5 MG tablet Take 1 tablet by mouth every morning.     aspirin  EC 81 MG tablet Take 81 mg by mouth daily. Swallow whole.     Multiple Vitamin (MULTIVITAMIN ADULT PO) Take 1 tablet by mouth daily.     Omega-3 Fatty Acids (FISH OIL PO) Take 1,200 mg by mouth. 360 mg Omega 3     Current Facility-Administered Medications  Medication Dose Route Frequency Provider Last Rate Last Admin   0.9 %  sodium chloride  infusion  500 mL Intravenous Once Levon Penning E, MD        Allergies as of  02/19/2024   (No Known Allergies)    Family History  Problem Relation Age of Onset   Diabetes Father    Prostate cancer Paternal Uncle    Colon polyps Paternal Uncle    Colon cancer Neg Hx    Stomach cancer Neg Hx    Esophageal cancer Neg Hx    Pancreatic cancer Neg Hx    Rectal cancer Neg Hx     Social History   Socioeconomic History   Marital status: Married    Spouse name: Not on file   Number of children: Not on file   Years of education: Not on file   Highest education level: Not on file  Occupational History   Not on file  Tobacco Use   Smoking status: Never    Passive exposure: Never   Smokeless  tobacco: Never  Vaping Use   Vaping status: Never Used  Substance and Sexual Activity   Alcohol use: Yes    Alcohol/week: 2.0 standard drinks of alcohol    Types: 2 Standard drinks or equivalent per week    Comment: Occassionally   Drug use: No   Sexual activity: Not on file  Other Topics Concern   Not on file  Social History Narrative   Not on file   Social Drivers of Health   Tobacco Use: Low Risk (02/19/2024)   Patient History    Smoking Tobacco Use: Never    Smokeless Tobacco Use: Never    Passive Exposure: Never  Financial Resource Strain: Not on file  Food Insecurity: Patient Declined (02/07/2024)   Epic    Worried About Programme Researcher, Broadcasting/film/video in the Last Year: Patient declined    Barista in the Last Year: Patient declined  Transportation Needs: Patient Declined (02/07/2024)   Epic    Lack of Transportation (Medical): Patient declined    Lack of Transportation (Non-Medical): Patient declined  Physical Activity: Not on file  Stress: Not on file  Social Connections: Not on file  Intimate Partner Violence: Patient Declined (02/07/2024)   Epic    Fear of Current or Ex-Partner: Patient declined    Emotionally Abused: Patient declined    Physically Abused: Patient declined    Sexually Abused: Patient declined  Depression (PHQ2-9): Low Risk (02/07/2024)   Depression (PHQ2-9)    PHQ-2 Score: 0  Alcohol Screen: Not on file  Housing: Unknown (02/07/2024)   Epic    Unable to Pay for Housing in the Last Year: Patient declined    Number of Times Moved in the Last Year: 0    Homeless in the Last Year: Patient declined  Utilities: Patient Declined (02/07/2024)   Epic    Threatened with loss of utilities: Patient declined  Health Literacy: Not on file    Review of Systems:  All other review of systems negative except as mentioned in the HPI.  Physical Exam: Vital signs BP 116/66   Pulse 88   Temp (!) 97.5 F (36.4 C) (Temporal)   Ht 6' 1 (1.854 m)   Wt 270 lb (122.5  kg)   SpO2 99%   BMI 35.62 kg/m   General:   Alert,  Well-developed, well-nourished, pleasant and cooperative in NAD Airway:  Mallampati 3 Lungs:  Clear throughout to auscultation.   Heart:  Regular rate and rhythm; no murmurs, clicks, rubs,  or gallops. Abdomen:  Soft, nontender and nondistended. Normal bowel sounds.   Neuro/Psych:  Normal mood and affect. A and O x 3  Koree Schopf E. Stacia, MD [facility]

## 2024-02-19 NOTE — Patient Instructions (Signed)
 Recommend daily MiraLax and drinking at least 64 oz water  daily.  YOU HAD AN ENDOSCOPIC PROCEDURE TODAY AT THE [facility]:   Refer to the procedure report that was given to you for any specific questions about what was found during the examination.  If the procedure report does not answer your questions, please call your gastroenterologist to clarify.  If you requested that your care partner not be given the details of your procedure findings, then the procedure report has been included in a sealed envelope for you to review at your convenience later.  YOU SHOULD EXPECT: Some feelings of bloating in the abdomen. Passage of more gas than usual.  Walking can help get rid of the air that was put into your GI tract during the procedure and reduce the bloating. If you had a lower endoscopy (such as a colonoscopy or flexible sigmoidoscopy) you may notice spotting of blood in your stool or on the toilet paper. If you underwent a bowel prep for your procedure, you may not have a normal bowel movement for a few days.  Please Note:  You might notice some irritation and congestion in your nose or some drainage.  This is from the oxygen used during your procedure.  There is no need for concern and it should clear up in a day or so.  SYMPTOMS TO REPORT IMMEDIATELY:  Following lower endoscopy (colonoscopy or flexible sigmoidoscopy):  Excessive amounts of blood in the stool  Significant tenderness or worsening of abdominal pains  Swelling of the abdomen that is new, acute  Fever of 100F or higher  For urgent or emergent issues, a gastroenterologist can be reached at any hour by calling [phone]. Do not use MyChart messaging for urgent concerns.    DIET:  We do recommend a small meal at first, but then you may proceed to your regular diet.  Drink plenty of fluids but you should avoid alcoholic beverages for 24 hours.  ACTIVITY:  You should plan to take it easy for the rest of today and you  should NOT DRIVE or use heavy machinery until tomorrow (because of the sedation medicines used during the test).    FOLLOW UP: Our staff will call the number listed on your records the next business day following your procedure.  We will call around 7:15- 8:00 am to check on you and address any questions or concerns that you may have regarding the information given to you following your procedure. If we do not reach you, we will leave a message.     If any biopsies were taken you will be contacted by phone or by letter within the next 1-3 weeks.  Please call us  at [phone] if you have not heard about the biopsies in 3 weeks.    SIGNATURES/CONFIDENTIALITY: You and/or your care partner have signed paperwork which will be entered into your electronic medical record.  These signatures attest to the fact that that the information above on your After Visit Summary has been reviewed and is understood.  Full responsibility of the confidentiality of this discharge information lies with you and/or your care-partner.

## 2024-02-19 NOTE — Progress Notes (Signed)
 To pacu, VSS. Report to [email or web address]

## 2024-02-19 NOTE — Progress Notes (Signed)
 Called to room to assist during endoscopic procedure.  Patient ID and intended procedure confirmed with present staff. Received instructions for my participation in the procedure from the performing physician.

## 2024-02-19 NOTE — Op Note (Signed)
 [facility] Patient Name: Roy [name] Procedure Date: 02/19/2024 9:01 AM MRN: [id number] Endoscopist: Glendia E. Stacia , MD, [id number] Age: 64 Referring MD:  Date of Birth: 1961-01-28 Gender: Male Account #: [id number] Procedure:                Colonoscopy Indications:              Screening for colorectal malignant neoplasm,                            inadequate bowel prep on last colonoscopy (more                            recent than 10 years ago). Patient does report                            recurrent symptoms of abdominal bloating and                            distention, followed by multiple bowel movements                            with improvement in distention. Medicines:                Monitored Anesthesia Care Procedure:                Pre-Anesthesia Assessment:                           - Prior to the procedure, a History and Physical                            was performed, and patient medications and                            allergies were reviewed. The patient's tolerance of                            previous anesthesia was also reviewed. The risks                            and benefits of the procedure and the sedation                            options and risks were discussed with the patient.                            All questions were answered, and informed consent                            was obtained. Prior Anticoagulants: The patient has                            taken no anticoagulant or antiplatelet agents  except for aspirin . ASA Grade Assessment: II - A                            patient with mild systemic disease. After reviewing                            the risks and benefits, the patient was deemed in                            satisfactory condition to undergo the procedure.                           After obtaining informed consent, the colonoscope                            was passed under  direct vision. Throughout the                            procedure, the patient's blood pressure, pulse, and                            oxygen saturations were monitored continuously. The                            Olympus Scope SN: [id number] was introduced through                            the anus and advanced to the the cecum, identified                            by appendiceal orifice and ileocecal valve. The                            colonoscopy was unusually difficult due to bowel                            stenosis, poor bowel prep, a redundant colon and                            significant looping. Successful completion of the                            procedure was aided by using manual pressure. The                            patient tolerated the procedure well. The quality                            of the bowel preparation was fair. Almost 4 liters                            of stool was lavaged from the colon. The ileocecal  valve, appendiceal orifice, and rectum were                            photographed. The bowel preparation used was                            Miralax and SUPREP via extended prep with split                            dose instruction. Scope In: 9:24:55 AM Scope Out: 10:14:58 AM Scope Withdrawal Time: 0 hours 17 minutes 30 seconds  Total Procedure Duration: 0 hours 50 minutes 3 seconds  Findings:                 The perianal and digital rectal examinations were                            normal. Pertinent negatives include normal                            sphincter tone and no palpable rectal lesions.                           Multiple medium-mouthed and small-mouthed                            diverticula were found in the sigmoid colon,                            descending colon and transverse colon. There was a                            tight narrowing of the sigmoid colon from about                            25-30  cm. The colon proximal to this narrowing                            appeared dilated. Biopsies were taken with a cold                            forceps for histology. Estimated blood loss was                            minimal.                           The exam was otherwise normal throughout the                            examined colon.                           The retroflexed view of the distal rectum and anal  verge was normal and showed no anal or rectal                            abnormalities. Complications:            No immediate complications. Estimated Blood Loss:     Estimated blood loss was minimal. Impression:               - Preparation of the colon was poor.                           - Diverticulosis in the sigmoid colon, in the                            descending colon and in the transverse colon. There                            was narrowing of the colon in the sigmoid colon,                            consistent with previous examinations. Biopsied.                           - The distal rectum and anal verge are normal on                            retroflexion view. Recommendation:           - Patient has a contact number available for                            emergencies. The signs and symptoms of potential                            delayed complications were discussed with the                            patient. Return to normal activities tomorrow.                            Written discharge instructions were provided to the                            patient.                           - Resume previous diet.                           - Continue present medications.                           - Await pathology results.                           - Repeat colonoscopy in 3 years because the bowel  preparation was fair despite a 2-day bowel prep.                           - Recommend referral to colorectal  surgery to                            consider prophylactic sigmoidectomy. Patient has a                            persistent sigmoid narrowing with proximal dilation                            and symptoms suggestive of mild partial colonic                            obstruction.                           - Recommend daily MiraLax and drinking at least 64                            oz water  daily Mattisyn Cardona E. Stacia, MD 02/19/2024 89:71:51 AM This report has been signed electronically.

## 2024-02-20 ENCOUNTER — Telehealth: Payer: Self-pay

## 2024-02-20 NOTE — Telephone Encounter (Signed)
 Left message on answering machine.

## 2024-02-21 LAB — SURGICAL PATHOLOGY

## 2024-02-22 ENCOUNTER — Ambulatory Visit: Payer: Self-pay | Admitting: Gastroenterology

## 2024-02-22 NOTE — Progress Notes (Signed)
 Mr. Roseboom,  The biopsies of the narrowed segment of your colon showed benign inflammatory changes.  No cancerous or precancerous changes were noted.  As discussed, I think this narrowing in your colon is related to diverticulosis, and given your symptoms and the potential for worsening of the narrowing/colonic obstruction, please meet with the colorectal surgeon and discuss whether a prophylactic surgery would be in your best interests.  Please repeat colonoscopy in 3 years given the suboptimal bowel prep.

## 2024-05-08 ENCOUNTER — Ambulatory Visit: Admitting: Nurse Practitioner

## 2024-05-13 ENCOUNTER — Ambulatory Visit: Admitting: Cardiology
